# Patient Record
Sex: Male | Born: 1950
Health system: Southern US, Community
[De-identification: ages and names within clinical notes are randomized; demographics above are authoritative.]

## PROBLEM LIST (undated history)

## (undated) DIAGNOSIS — E039 Hypothyroidism, unspecified: Secondary | ICD-10-CM

## (undated) DIAGNOSIS — M1990 Unspecified osteoarthritis, unspecified site: Secondary | ICD-10-CM

## (undated) HISTORY — DX: Unspecified osteoarthritis, unspecified site: M19.90

## (undated) HISTORY — DX: Hypothyroidism, unspecified: E03.9

---

## 2009-06-02 HISTORY — PX: COLONOSCOPY: SHX174

## 2009-09-13 ENCOUNTER — Encounter (INDEPENDENT_AMBULATORY_CARE_PROVIDER_SITE_OTHER): Payer: Self-pay | Admitting: *Deleted

## 2009-09-17 ENCOUNTER — Ambulatory Visit: Payer: Self-pay | Admitting: Gastroenterology

## 2009-10-01 ENCOUNTER — Ambulatory Visit: Payer: Self-pay | Admitting: Gastroenterology

## 2010-07-02 NOTE — Procedures (Signed)
Summary: Colonoscopy  Patient: Derek Lowe Note: All result statuses are Final unless otherwise noted.  Tests: (1) Colonoscopy (COL)   COL Colonoscopy           DONE     [facility]     [address]     [place], [place]  [address]           COLONOSCOPY PROCEDURE REPORT           PATIENT:  Derek Lowe, Derek Lowe  MR#:  [id number]     BIRTHDATE:  04-01-1951, 59 yrs. old  GENDER:  male     ENDOSCOPIST:  Rachael Fee, MD     REF. BY:  Janace Hoard, M.D.     PROCEDURE DATE:  10/01/2009     PROCEDURE:  Diagnostic Colonoscopy     ASA CLASS:  Class II     INDICATIONS:  Routine Risk Screening     MEDICATIONS:   Fentanyl 50 mcg IV, Versed 6 mg IV           DESCRIPTION OF PROCEDURE:   After the risks benefits and     alternatives of the procedure were thoroughly explained, informed     consent was obtained.  Digital rectal exam was performed and     revealed no rectal masses.   The LB PCF-H180AL [id number] endoscope     was introduced through the anus and advanced to the cecum, which     was identified by both the appendix and ileocecal valve, without     limitations.  The quality of the prep was good, using MoviPrep.     The instrument was then slowly withdrawn as the colon was fully     examined.           <<PROCEDUREIMAGES>>           FINDINGS:  External hemorrhoids were found. These were small and     not thrombosed.  Small scattered diverticula were found throughout     the colon.  This was otherwise a normal examination of the colon     (see image1, image2, and image3).   Retroflexed views in the     rectum revealed no abnormalities.    The scope was then withdrawn     from the patient and the procedure completed.           COMPLICATIONS:  None           ENDOSCOPIC IMPRESSION:     1) Small external hemorrhoids     2) Diverticula, scattered throughout the colon     3) Otherwise normal examination           RECOMMENDATIONS:     1) Continue current colorectal screening  recommendations for     "routine risk" patients with a repeat colonoscopy in 10 years.           REPEAT EXAM:  10 years           ______________________________     Rachael Fee, MD           n.     eSIGNED:   Rachael Fee at 10/01/2009 10:14 AM           Domenica Fail, [id number]  Note: An exclamation mark (!) indicates a result that was not dispersed into the flowsheet. Document Creation Date: 10/01/2009 10:14 AM _______________________________________________________________________  (1) Order result status: Final Collection or observation date-time: 10/01/2009 10:11 Requested date-time:  Receipt date-time:  Reported date-time:  Referring Physician:   Ordering Physician: Rob Bunting [phone]) Specimen Source:  Source: Launa Grill Order Number: [id number] Lab site:   Appended Document: Colonoscopy    Clinical Lists Changes  Observations: Added new observation of COLONNXTDUE: 10/2019 (10/01/2009 15:52)

## 2010-07-02 NOTE — Letter (Signed)
Summary: [facility] Instructions  [facility]  [address] [place], [place] [address]   Phone: [phone]  Fax: [phone]       Derek Lowe    March 03, 1951    MRN: [id number]        Procedure Day [name]:  Derek Lowe  10/01/09     Arrival Time:  9:00AM     Procedure Time:  10:00AM     Location of Procedure:                    _ X_  [facility] (4th Floor)                        PREPARATION FOR COLONOSCOPY WITH MOVIPREP   Starting 5 days prior to your procedure 09/26/09 do not eat nuts, seeds, popcorn, corn, beans, peas,  salads, or any raw vegetables.  Do not take any fiber supplements (e.g. Metamucil, Citrucel, and Benefiber).  THE DAY BEFORE YOUR PROCEDURE         DATE: 09/30/09  DAY: SUNDAY  1.  Drink clear liquids the entire day-NO SOLID FOOD  2.  Do not drink anything colored red or purple.  Avoid juices with pulp.  No orange juice.  3.  Drink at least 64 oz. (8 glasses) of fluid/clear liquids during the day to prevent dehydration and help the prep work efficiently.  CLEAR LIQUIDS INCLUDE: Water Jello Ice Popsicles Tea (sugar ok, no milk/cream) Powdered fruit flavored drinks Coffee (sugar ok, no milk/cream) Gatorade Juice: apple, white grape, white cranberry  Lemonade Clear bullion, consomm, broth Carbonated beverages (any kind) Strained chicken noodle soup Hard Candy                             4.  In the morning, mix first dose of MoviPrep solution:    Empty 1 Pouch A and 1 Pouch B into the disposable container    Add lukewarm drinking water to the top line of the container. Mix to dissolve    Refrigerate (mixed solution should be used within 24 hrs)  5.  Begin drinking the prep at 5:00 p.m. The MoviPrep container is divided by 4 marks.   Every 15 minutes drink the solution down to the next mark (approximately 8 oz) until the full liter is complete.   6.  Follow completed prep with 16 oz of clear liquid of your choice (Nothing  red or purple).  Continue to drink clear liquids until bedtime.  7.  Before going to bed, mix second dose of MoviPrep solution:    Empty 1 Pouch A and 1 Pouch B into the disposable container    Add lukewarm drinking water to the top line of the container. Mix to dissolve    Refrigerate  THE DAY OF YOUR PROCEDURE      DATE: 10/01/09  DAY: MONDAY  Beginning at 5:00AM (5 hours before procedure):         1. Every 15 minutes, drink the solution down to the next mark (approx 8 oz) until the full liter is complete.  2. Follow completed prep with 16 oz. of clear liquid of your choice.    3. You may drink clear liquids until 8:00AM (2 HOURS BEFORE PROCEDURE).   MEDICATION INSTRUCTIONS  Unless otherwise instructed, you should take regular prescription medications with a small sip of water   as early as possible the morning  of your procedure.   Additional medication instructions: n/a         OTHER INSTRUCTIONS  You will need a responsible adult at least 59 years of age to accompany you and drive you home.   This person must remain in the waiting room during your procedure.  Wear loose fitting clothing that is easily removed.  Leave jewelry and other valuables at home.  However, you may wish to bring a book to read or  an iPod/MP3 player to listen to music as you wait for your procedure to start.  Remove all body piercing jewelry and leave at home.  Total time from sign-in until discharge is approximately 2-3 hours.  You should go home directly after your procedure and rest.  You can resume normal activities the  day after your procedure.  The day of your procedure you should not:   Drive   Make legal decisions   Operate machinery   Drink alcohol   Return to work  You will receive specific instructions about eating, activities and medications before you leave.    The above instructions have been reviewed and explained to me by  Sherren Kerns RN  September 17, 2009 1:00  PM      I fully understand and can verbalize these instructions _____________________________ Date _________

## 2010-07-02 NOTE — Miscellaneous (Signed)
Summary: previsit/rm  Clinical Lists Changes  Medications: Added new medication of MOVIPREP 100 GM  SOLR (PEG-KCL-NACL-NASULF-NA ASC-C) As per prep instructions. - Signed Rx of MOVIPREP 100 GM  SOLR (PEG-KCL-NACL-NASULF-NA ASC-C) As per prep instructions.;  #1 x 0;  Signed;  Entered by: Sherren Kerns RN;  Authorized by: Rachael Fee MD;  Method used: Print then Give to Patient Observations: Added new observation of ALLERGY REV: Done (09/17/2009 12:38) Added new observation of NKA: T (09/17/2009 12:38)    Prescriptions: MOVIPREP 100 GM  SOLR (PEG-KCL-NACL-NASULF-NA ASC-C) As per prep instructions.  #1 x 0   Entered by:   Sherren Kerns RN   Authorized by:   Rachael Fee MD   Signed by:   Sherren Kerns RN on 09/17/2009   Method used:   Print then Give to Patient   RxID:   [id number]

## 2017-03-12 DIAGNOSIS — E7800 Pure hypercholesterolemia, unspecified: Secondary | ICD-10-CM | POA: Diagnosis not present

## 2017-03-12 DIAGNOSIS — N419 Inflammatory disease of prostate, unspecified: Secondary | ICD-10-CM | POA: Diagnosis not present

## 2017-03-12 DIAGNOSIS — E119 Type 2 diabetes mellitus without complications: Secondary | ICD-10-CM | POA: Diagnosis not present

## 2017-03-12 DIAGNOSIS — E039 Hypothyroidism, unspecified: Secondary | ICD-10-CM | POA: Diagnosis not present

## 2017-03-12 DIAGNOSIS — Z139 Encounter for screening, unspecified: Secondary | ICD-10-CM | POA: Diagnosis not present

## 2017-03-12 DIAGNOSIS — Z0000 Encounter for general adult medical examination without abnormal findings: Secondary | ICD-10-CM | POA: Diagnosis not present

## 2017-03-27 DIAGNOSIS — E041 Nontoxic single thyroid nodule: Secondary | ICD-10-CM | POA: Diagnosis not present

## 2017-03-27 DIAGNOSIS — R946 Abnormal results of thyroid function studies: Secondary | ICD-10-CM | POA: Diagnosis not present

## 2017-03-27 DIAGNOSIS — E039 Hypothyroidism, unspecified: Secondary | ICD-10-CM | POA: Diagnosis not present

## 2017-03-27 DIAGNOSIS — R799 Abnormal finding of blood chemistry, unspecified: Secondary | ICD-10-CM | POA: Diagnosis not present

## 2017-04-02 DIAGNOSIS — E041 Nontoxic single thyroid nodule: Secondary | ICD-10-CM | POA: Diagnosis not present

## 2017-04-02 DIAGNOSIS — E039 Hypothyroidism, unspecified: Secondary | ICD-10-CM | POA: Diagnosis not present

## 2017-04-29 DIAGNOSIS — N529 Male erectile dysfunction, unspecified: Secondary | ICD-10-CM | POA: Diagnosis not present

## 2017-04-29 DIAGNOSIS — E039 Hypothyroidism, unspecified: Secondary | ICD-10-CM | POA: Diagnosis not present

## 2017-04-29 DIAGNOSIS — E041 Nontoxic single thyroid nodule: Secondary | ICD-10-CM | POA: Diagnosis not present

## 2017-05-06 ENCOUNTER — Ambulatory Visit (INDEPENDENT_AMBULATORY_CARE_PROVIDER_SITE_OTHER): Payer: Medicare Other | Admitting: Endocrinology

## 2017-05-06 ENCOUNTER — Encounter: Payer: Self-pay | Admitting: Endocrinology

## 2017-05-06 DIAGNOSIS — E039 Hypothyroidism, unspecified: Secondary | ICD-10-CM | POA: Insufficient documentation

## 2017-05-06 DIAGNOSIS — E042 Nontoxic multinodular goiter: Secondary | ICD-10-CM

## 2017-05-06 LAB — TSH: TSH: 3.06 u[IU]/mL (ref 0.35–4.50)

## 2017-05-06 NOTE — Progress Notes (Signed)
Subjective:    Patient ID: Derek Lowe, male    DOB: [birth year], 66 y.o.   MRN: [id number]  HPI Pt reports hypothyroidism was dx'ed in 2018.  He was rx'ed synthroid.  He has never taken kelp or any other type of non-prescribed thyroid product.  He has never had thyroid surgery, or XRT to the neck.  He has never been on amiodarone or lithium.   He has slight itching, but no assoc leg edema History reviewed. No pertinent past medical history.  History reviewed. No pertinent surgical history.  Social History   Socioeconomic History  . Marital status: Married    Spouse name: Not on file  . Number of children: Not on file  . Years of education: Not on file  . Highest education level: Not on file  Social Needs  . Financial resource strain: Not on file  . Food insecurity - worry: Not on file  . Food insecurity - inability: Not on file  . Transportation needs - medical: Not on file  . Transportation needs - non-medical: Not on file  Occupational History  . Not on file  Tobacco Use  . Smoking status: Never Smoker  . Smokeless tobacco: Current User    Types: Chew  Substance and Sexual Activity  . Alcohol use: Yes  . Drug use: No  . Sexual activity: Yes  Other Topics Concern  . Not on file  Social History Narrative  . Not on file    Current Outpatient Medications on File Prior to Visit  Medication Sig Dispense Refill  . levothyroxine (SYNTHROID, LEVOTHROID) 25 MCG tablet Take 25 mcg by mouth daily before breakfast.     No current facility-administered medications on file prior to visit.     No Known Allergies  Family History  Problem Relation Age of Onset  . Thyroid disease Sister    BP (!) 142/80 (BP Location: Right Arm, Patient Position: Sitting, Cuff Size: Normal)   Pulse 87   Wt 201 lb (91.2 kg)   SpO2 97%   Review of Systems Denies hoarseness, neck pain, visual loss, chest pain, sob, cough, dysphagia, diarrhea, flushing, easy bruising, depression, cold  intolerance, headache, numbness, and rhinorrhea.  He has lost weight, due to his efforts.     Objective:   Physical Exam VS: see vs page GEN: no distress HEAD: head: no deformity eyes: no periorbital swelling, no proptosis external nose and ears are normal mouth: no lesion seen NECK: supple, thyroid is not enlarged, but the LLP nodule is palpable CHEST WALL: no deformity LUNGS: clear to auscultation CV: reg rate and rhythm, no murmur ABD: abdomen is soft, nontender.  no hepatosplenomegaly.  not distended.  no hernia MUSCULOSKELETAL: muscle bulk and strength are grossly normal.  no obvious joint swelling.  gait is normal and steady EXTEMITIES: no deformity.  no edema PULSES: no carotid bruit NEURO:  cn 2-12 grossly intact.   readily moves all 4's.  sensation is intact to touch on all 4's SKIN:  Normal texture and temperature.  No rash or suspicious lesion is visible.   NODES:  None palpable at the neck PSYCH: alert, well-oriented.  Does not appear anxious nor depressed.  I have reviewed outside records, and summarized: Pt was noted to have elevated a1c, and referred here.  He did not have any other active medical probs.  [name]: multinodular goiter.  The largest nodule was at the [place]  outside test results are reviewed: TSH=4.5     Assessment & Plan:  Hypothyroidism, new, usually due to chronic thyroiditis Multinodular goiter, with uncertain relationship to hypothyroidism.  We'll follow  Patient Instructions  blood tests are requested for you today.  We'll let you know about the results. Please come back for a follow-up appointment in 6-12 months, when you will be due for another ultrasound (if you want to redo at [facility], that would be fine, too).

## 2017-05-06 NOTE — Patient Instructions (Addendum)
blood tests are requested for you today.  We'll let you know about the results. Please come back for a follow-up appointment in 6-12 months, when you will be due for another ultrasound (if you want to redo at [facility], that would be fine, too).

## 2017-05-10 LAB — CALCITONIN: CALCITONIN: 2 pg/mL (ref <=10)

## 2017-05-22 ENCOUNTER — Telehealth: Payer: Self-pay | Admitting: Endocrinology

## 2017-05-22 NOTE — Telephone Encounter (Signed)
Please see note. This regarding the levothyroxine he thinks is causing him to break out in this rash.

## 2017-05-22 NOTE — Telephone Encounter (Signed)
The rash he had returned yesterday he was putting lotion on it but it did go away for about 2-3 days. He is wondering if the med is still causing the rash

## 2017-05-22 NOTE — Telephone Encounter (Signed)
Try stopping x a few days, then resume.  Let us know what happens

## 2017-05-27 NOTE — Telephone Encounter (Signed)
Tried calling patient but he was not home. I left a message that I called with patient's wife.

## 2017-05-28 ENCOUNTER — Telehealth: Payer: Self-pay

## 2017-05-28 NOTE — Telephone Encounter (Signed)
Patient called back & stated that he had stopped levothyroxine over a week ago, but was still itching. I advised him to start taking mediation again & he may want to follow up with PCP.

## 2017-05-29 DIAGNOSIS — L300 Nummular dermatitis: Secondary | ICD-10-CM | POA: Diagnosis not present

## 2017-05-29 DIAGNOSIS — L299 Pruritus, unspecified: Secondary | ICD-10-CM | POA: Diagnosis not present

## 2017-06-30 ENCOUNTER — Telehealth: Payer: Self-pay | Admitting: Endocrinology

## 2017-06-30 NOTE — Telephone Encounter (Signed)
LM for pt to call back with PCP name and referring MD name

## 2017-06-30 NOTE — Telephone Encounter (Signed)
-----   Message from Renato Shin, MD sent at 05/08/2017  7:53 PM EST ----- This pt does not have a ref provider, thanks

## 2017-08-12 ENCOUNTER — Telehealth: Payer: Self-pay | Admitting: Endocrinology

## 2017-08-12 ENCOUNTER — Other Ambulatory Visit: Payer: Self-pay

## 2017-08-12 MED ORDER — LEVOTHYROXINE SODIUM 25 MCG PO TABS: 25.0000 ug | ORAL_TABLET | Freq: Every day | ORAL | 4 refills | Status: DC

## 2017-08-12 NOTE — Telephone Encounter (Signed)
Patient's wife,Vickie,called.  Patient needs a refill on his Levothyroxine.  Patient has 4 pills left.  Please call in refill to [facility].

## 2017-08-12 NOTE — Telephone Encounter (Signed)
I have sent to patient;'s pharmacy.  

## 2017-08-31 DIAGNOSIS — L501 Idiopathic urticaria: Secondary | ICD-10-CM | POA: Diagnosis not present

## 2017-08-31 DIAGNOSIS — L299 Pruritus, unspecified: Secondary | ICD-10-CM | POA: Diagnosis not present

## 2017-08-31 DIAGNOSIS — L309 Dermatitis, unspecified: Secondary | ICD-10-CM | POA: Diagnosis not present

## 2017-10-09 DIAGNOSIS — L501 Idiopathic urticaria: Secondary | ICD-10-CM | POA: Diagnosis not present

## 2017-10-09 DIAGNOSIS — L578 Other skin changes due to chronic exposure to nonionizing radiation: Secondary | ICD-10-CM | POA: Diagnosis not present

## 2017-10-09 DIAGNOSIS — R531 Weakness: Secondary | ICD-10-CM | POA: Diagnosis not present

## 2018-05-07 ENCOUNTER — Ambulatory Visit: Payer: 59 | Admitting: Endocrinology

## 2018-05-10 ENCOUNTER — Encounter: Payer: Self-pay | Admitting: Endocrinology

## 2018-05-10 ENCOUNTER — Ambulatory Visit (INDEPENDENT_AMBULATORY_CARE_PROVIDER_SITE_OTHER): Payer: Medicare Other | Admitting: Endocrinology

## 2018-05-10 VITALS — BP 136/86 | HR 78 | Ht 68.5 in | Wt 197.8 lb

## 2018-05-10 DIAGNOSIS — E039 Hypothyroidism, unspecified: Secondary | ICD-10-CM

## 2018-05-10 DIAGNOSIS — E042 Nontoxic multinodular goiter: Secondary | ICD-10-CM | POA: Diagnosis not present

## 2018-05-10 LAB — T4, FREE: Free T4: 0.64 ng/dL (ref 0.60–1.60)

## 2018-05-10 LAB — TSH: TSH: 5.15 u[IU]/mL — ABNORMAL HIGH (ref 0.35–4.50)

## 2018-05-10 MED ORDER — LEVOTHYROXINE SODIUM 50 MCG PO TABS: 25.0000 ug | ORAL_TABLET | Freq: Every day | ORAL | 6 refills | Status: DC

## 2018-05-10 NOTE — Progress Notes (Signed)
Subjective:    Patient ID: Derek Lowe, male    DOB: 1951-03-21, 67 y.o.   MRN: [id number]  HPI  Pt returns for f/u hypothyroidism (dx'ed 2018; he has been on synthroid since then; [name] in [place] showed multinodular goiter--the largest nodule was at the [facility]).  He stopped synthroid, due to itching.  Denies neck swelling.   Past Medical History:  Diagnosis Date  . Hypothyroidism     No past surgical history on file.  Social History   Socioeconomic History  . Marital status: Married    Spouse name: Not on file  . Number of children: Not on file  . Years of education: Not on file  . Highest education level: Not on file  Occupational History  . Not on file  Social Needs  . Financial resource strain: Not on file  . Food insecurity:    Worry: Not on file    Inability: Not on file  . Transportation needs:    Medical: Not on file    Non-medical: Not on file  Tobacco Use  . Smoking status: Never Smoker  . Smokeless tobacco: Current User    Types: Chew  Substance and Sexual Activity  . Alcohol use: Yes  . Drug use: No  . Sexual activity: Yes  Lifestyle  . Physical activity:    Days per week: Not on file    Minutes per session: Not on file  . Stress: Not on file  Relationships  . Social connections:    Talks on phone: Not on file    Gets together: Not on file    Attends religious service: Not on file    Active member of club or organization: Not on file    Attends meetings of clubs or organizations: Not on file    Relationship status: Not on file  . Intimate partner violence:    Fear of current or ex partner: Not on file    Emotionally abused: Not on file    Physically abused: Not on file    Forced sexual activity: Not on file  Other Topics Concern  . Not on file  Social History Narrative  . Not on file    No current outpatient medications on file prior to visit.   No current facility-administered medications on file prior to visit.     No Known  Allergies  Family History  Problem Relation Age of Onset  . Thyroid disease Sister     BP 136/86 (BP Location: Left Arm, Patient Position: Sitting, Cuff Size: Normal)   Pulse 78   Ht 5' 8.5" (1.74 m)   Wt 197 lb 12.8 oz (89.7 kg)   SpO2 97%   BMI 29.63 kg/m   Review of Systems Denies neck pain    Objective:   Physical Exam VITAL SIGNS:  See vs page GENERAL: no distress NECK: thyroid is slightly enlarged (L>R), with irreg surface  Lab Results  Component Value Date   TSH 5.15 (H) 05/10/2018       Assessment & Plan:  Hypothyroidism: I have sent a prescription to your pharmacy, to resume synthroid. Multinodular goiter: due fore recheck.   Patient Instructions  blood tests are requested for you today.  We'll let you know about the results.  Let's recheck the ultrasound.  you will receive a phone call, about a day and time for an appointment.   Please sign a release of info for the ultrasound report from last year.   Please come back for  a follow-up appointment in 1 year.

## 2018-05-10 NOTE — Patient Instructions (Addendum)
blood tests are requested for you today.  We'll let you know about the results.  Let's recheck the ultrasound.  you will receive a phone call, about a day and time for an appointment.   Please sign a release of info for the ultrasound report from last year.   Please come back for a follow-up appointment in 1 year.

## 2018-05-11 ENCOUNTER — Telehealth: Payer: Self-pay | Admitting: Endocrinology

## 2018-05-11 NOTE — Telephone Encounter (Signed)
Closed as duplicate 

## 2018-05-11 NOTE — Telephone Encounter (Signed)
Patient called and is returning a call to returning a call to [name]

## 2018-05-17 ENCOUNTER — Other Ambulatory Visit: Payer: Medicare Other

## 2018-07-22 ENCOUNTER — Telehealth: Payer: Self-pay | Admitting: Endocrinology

## 2018-07-22 NOTE — Telephone Encounter (Signed)
please contact patient: We received [name] report from 2018.  You should recheck this.  Have you been called about your appt yet?

## 2018-07-22 NOTE — Telephone Encounter (Signed)
Called pt to make him aware. States he was certain he had [name] completed at [facility]. Provided with contact #[phone] and advised to further discuss if he did have completed. If not, advised he schedule an appt for completion. Verbalized acceptance and understanding.

## 2018-07-27 ENCOUNTER — Ambulatory Visit
Admission: RE | Admit: 2018-07-27 | Discharge: 2018-07-27 | Disposition: A | Discharge status: Home / Self Care | Payer: Medicare Other | Source: Ambulatory Visit | Attending: Endocrinology | Admitting: Endocrinology

## 2018-07-27 DIAGNOSIS — E042 Nontoxic multinodular goiter: Secondary | ICD-10-CM

## 2018-07-29 ENCOUNTER — Other Ambulatory Visit: Payer: Self-pay | Admitting: Endocrinology

## 2018-07-29 DIAGNOSIS — E042 Nontoxic multinodular goiter: Secondary | ICD-10-CM

## 2018-08-03 ENCOUNTER — Other Ambulatory Visit (HOSPITAL_COMMUNITY)
Admission: RE | Admit: 2018-08-03 | Discharge: 2018-08-03 | Disposition: A | Discharge status: Home / Self Care | Payer: Medicare Other | Source: Ambulatory Visit | Attending: Physician Assistant | Admitting: Physician Assistant

## 2018-08-03 ENCOUNTER — Ambulatory Visit
Admission: RE | Admit: 2018-08-03 | Discharge: 2018-08-03 | Disposition: A | Discharge status: Home / Self Care | Payer: Medicare Other | Source: Ambulatory Visit | Attending: Endocrinology | Admitting: Endocrinology

## 2018-08-03 DIAGNOSIS — E042 Nontoxic multinodular goiter: Secondary | ICD-10-CM

## 2018-08-03 DIAGNOSIS — E041 Nontoxic single thyroid nodule: Secondary | ICD-10-CM | POA: Diagnosis not present

## 2018-08-03 NOTE — Procedures (Signed)
PROCEDURE SUMMARY:  Using direct ultrasound guidance, 5 passes were made using 25 g needles into the nodule within the left lobe of the thyroid.   Ultrasound was used to confirm needle placements on all occasions.   EBL = trace  Specimens were sent to Pathology for analysis.  See procedure note under Imaging tab in Epic for full procedure details.  Hrishikesh Hoeg S Arlee Bossard PA-C 08/03/2018 3:04 PM

## 2019-02-10 ENCOUNTER — Other Ambulatory Visit: Payer: Self-pay

## 2019-02-14 ENCOUNTER — Ambulatory Visit (INDEPENDENT_AMBULATORY_CARE_PROVIDER_SITE_OTHER): Payer: Medicare Other | Admitting: Endocrinology

## 2019-02-14 ENCOUNTER — Encounter: Payer: Self-pay | Admitting: Endocrinology

## 2019-02-14 ENCOUNTER — Telehealth: Payer: Self-pay | Admitting: Endocrinology

## 2019-02-14 ENCOUNTER — Other Ambulatory Visit: Payer: Self-pay

## 2019-02-14 VITALS — BP 128/60 | HR 82 | Ht 68.5 in | Wt 182.2 lb

## 2019-02-14 DIAGNOSIS — E039 Hypothyroidism, unspecified: Secondary | ICD-10-CM | POA: Diagnosis not present

## 2019-02-14 DIAGNOSIS — E042 Nontoxic multinodular goiter: Secondary | ICD-10-CM

## 2019-02-14 LAB — TSH: TSH: 2.59 u[IU]/mL (ref 0.35–4.50)

## 2019-02-14 LAB — T4, FREE: Free T4: 0.91 ng/dL (ref 0.60–1.60)

## 2019-02-14 MED ORDER — LEVOTHYROXINE SODIUM 50 MCG PO TABS: 25.0000 ug | ORAL_TABLET | Freq: Every day | ORAL | 0 refills | Status: DC

## 2019-02-14 NOTE — Telephone Encounter (Signed)
levothyroxine (SYNTHROID) 50 MCG tablet 15 tablet 0 02/14/2019    Sig - Route: Take 0.5 tablets (25 mcg total) by mouth daily before breakfast. - Oral   Sent to pharmacy as: levothyroxine (SYNTHROID) 50 MCG tablet   E-Prescribing Status: Receipt confirmed by pharmacy (02/14/2019 2:41 PM EDT)

## 2019-02-14 NOTE — Patient Instructions (Signed)
Let's recheck the ultrasound.  you will receive a phone call, about a day and time for an appointment. Blood tests are requested for you today.  We'll let you know about the results.  Please come back for a follow-up appointment in 6 months.   

## 2019-02-14 NOTE — Progress Notes (Signed)
   Subjective:    Patient ID: Derek Lowe, male    DOB: 06/19/1950, 68 y.o.   MRN: [id number]  HPI Pt returns for f/u hypothyroidism (dx'ed 2018; he has been on synthroid since then; [name] in [place] showed multinodular goiter; he did not tolerate synthroid 25 mcg, due to itching; f/u US in 2020 showed LLP nodule needs bx--this showed beth cat 1; he declined f/u bx).  pt states he feels well in general.   Past Medical History:  Diagnosis Date  . Hypothyroidism     No past surgical history on file.  Social History   Socioeconomic History  . Marital status: Married    Spouse name: Not on file  . Number of children: Not on file  . Years of education: Not on file  . Highest education level: Not on file  Occupational History  . Not on file  Social Needs  . Financial resource strain: Not on file  . Food insecurity    Worry: Not on file    Inability: Not on file  . Transportation needs    Medical: Not on file    Non-medical: Not on file  Tobacco Use  . Smoking status: Never Smoker  . Smokeless tobacco: Current User    Types: Chew  Substance and Sexual Activity  . Alcohol use: Yes  . Drug use: No  . Sexual activity: Yes  Lifestyle  . Physical activity    Days per week: Not on file    Minutes per session: Not on file  . Stress: Not on file  Relationships  . Social Herbalist on phone: Not on file    Gets together: Not on file    Attends religious service: Not on file    Active member of club or organization: Not on file    Attends meetings of clubs or organizations: Not on file    Relationship status: Not on file  . Intimate partner violence    Fear of current or ex partner: Not on file    Emotionally abused: Not on file    Physically abused: Not on file    Forced sexual activity: Not on file  Other Topics Concern  . Not on file  Social History Narrative  . Not on file    No current outpatient medications on file prior to visit.   No current  facility-administered medications on file prior to visit.     No Known Allergies  Family History  Problem Relation Age of Onset  . Thyroid disease Sister     BP 128/60 (BP Location: Left Arm, Patient Position: Sitting, Cuff Size: Normal)   Pulse 82   Ht 5' 8.5" (1.74 m)   Wt 182 lb 3.2 oz (82.6 kg)   SpO2 96%   BMI 27.30 kg/m    Review of Systems  Denies neck swelling.     Objective:   Physical Exam VITAL SIGNS:  See vs page.  GENERAL: no distress.  NECK: thyroid is slightly enlarged, with multinodular surface.  No palpable lymphadenopathy at the anterior neck.        Assessment & Plan:  MNG: due for recheck Hypothyroidism: well-replaced.   Patient Instructions  Let's recheck the ultrasound.  you will receive a phone call, about a day and time for an appointment. Blood tests are requested for you today.  We'll let you know about the results.  Please come back for a follow-up appointment in 6 months.

## 2019-02-14 NOTE — Telephone Encounter (Signed)
Patient just saw dr. Loanne Drilling and forgot to tell him that he just took his last pill (Levothyroxine). Patient requests RX be sent asap for: MEDICATION: levothyroxine (SYNTHROID, LEVOTHROID) 50 MCG tablet  PHARMACY:   [id number] [address], [place] [phone] (Phone) [phone] (Fax)     IS THIS A 90 DAY SUPPLY : ?  IS PATIENT OUT OF MEDICATION: Yes  IF NOT; HOW MUCH IS LEFT:   LAST APPOINTMENT DATE: @9 /14/2020  NEXT APPOINTMENT DATE:@Visit  date not found  DO WE HAVE YOUR PERMISSION TO LEAVE A DETAILED MESSAGE:Yes  OTHER COMMENTS:    **Let patient know to contact pharmacy at the end of the day to make sure medication is ready. **  ** Please notify patient to allow 48-72 hours to process**  **Encourage patient to contact the pharmacy for refills or they can request refills through [facility]**

## 2019-03-14 ENCOUNTER — Ambulatory Visit
Admission: RE | Admit: 2019-03-14 | Discharge: 2019-03-14 | Disposition: A | Discharge status: Home / Self Care | Payer: Medicare Other | Source: Ambulatory Visit | Attending: Endocrinology | Admitting: Endocrinology

## 2019-03-14 DIAGNOSIS — E041 Nontoxic single thyroid nodule: Secondary | ICD-10-CM | POA: Diagnosis not present

## 2019-03-14 DIAGNOSIS — E042 Nontoxic multinodular goiter: Secondary | ICD-10-CM

## 2019-05-09 ENCOUNTER — Ambulatory Visit: Payer: Medicare Other | Admitting: Endocrinology

## 2019-05-13 DIAGNOSIS — M1990 Unspecified osteoarthritis, unspecified site: Secondary | ICD-10-CM | POA: Diagnosis not present

## 2019-05-13 DIAGNOSIS — M25511 Pain in right shoulder: Secondary | ICD-10-CM | POA: Diagnosis not present

## 2019-07-20 ENCOUNTER — Ambulatory Visit (INDEPENDENT_AMBULATORY_CARE_PROVIDER_SITE_OTHER): Payer: Medicare Other

## 2019-07-20 ENCOUNTER — Other Ambulatory Visit: Payer: Self-pay

## 2019-07-20 ENCOUNTER — Ambulatory Visit (INDEPENDENT_AMBULATORY_CARE_PROVIDER_SITE_OTHER): Payer: Medicare Other | Admitting: Orthopedic Surgery

## 2019-07-20 ENCOUNTER — Ambulatory Visit: Payer: Self-pay

## 2019-07-20 DIAGNOSIS — M25512 Pain in left shoulder: Secondary | ICD-10-CM | POA: Diagnosis not present

## 2019-07-20 DIAGNOSIS — M7542 Impingement syndrome of left shoulder: Secondary | ICD-10-CM

## 2019-07-20 DIAGNOSIS — M25511 Pain in right shoulder: Secondary | ICD-10-CM

## 2019-07-20 DIAGNOSIS — M7541 Impingement syndrome of right shoulder: Secondary | ICD-10-CM

## 2019-07-22 ENCOUNTER — Encounter: Payer: Self-pay | Admitting: Orthopedic Surgery

## 2019-07-22 NOTE — Progress Notes (Signed)
Office Visit Note   Patient: Derek Lowe           Date of Birth: [birth year]           MRN: [id number] Visit Date: 07/20/2019 Requested by: No referring provider defined for this encounter. PCP: Patient, No Pcp Per  Subjective: Chief Complaint  Patient presents with  . Left Shoulder - Pain  . Right Shoulder - Pain    HPI: Derek Lowe is a 69 y.o. male who presents to the office complaining of bilateral shoulder pain.  Patient notes that for the last month and a half he has had bilateral shoulder pain with his right shoulder bothering him more than his left.  He denies any acute injury, stating that his pain came on when he woke up from sleep 1 day.  He localizes pain to the posterior aspect of his shoulders.  Pain radiates to the elbow.  He denies any neck pain/radicular symptoms/numbness/tingling.  Pain wakes him up at night.  He was seen by his primary care doctor who ordered x-rays that revealed acromioclavicular osteoarthritis.  He had 1 injection into presumably the [facility] joint as he describes "they stuck the needle straight down into my top of my shoulder".  This is 1 month ago and provided him some relief but pain has recurred.  He denies any weakness of the shoulder.  He has never had surgery on his neck or shoulder.  He has been taking meloxicam with some relief.  He denies any history of diabetes but does note a history of hypothyroidism for which he takes Synthroid..                ROS:  All systems reviewed are negative as they relate to the chief complaint within the history of present illness.  Patient denies fevers or chills.  Assessment & Plan: Visit Diagnoses:  1. Bilateral shoulder pain, unspecified chronicity   2. Impingement syndrome of right shoulder   3. Impingement syndrome of left shoulder     Plan: Patient is a 69 year old male who presents complaining of bilateral shoulder pain, right greater than left.  No injury was associated with the onset of pain.  He has no  weakness on exam and excellent range of motion.  He has good functional range of motion as well.  On exam he has excellent strength of all of the rotator cuff muscles but does have some pain with impingement tests.  Impression is impingement versus bursitis.  Plan for bilateral shoulder injections into the subacromial space today.  Patient will follow up in 6 weeks for clinical recheck.  We will decide whether or not to proceed with MRI of the shoulder at this time.  Patient agreed with plan and will follow up in 6 weeks.  Follow-Up Instructions: No follow-ups on file.   Orders:  Orders Placed This Encounter  Procedures  . XR Shoulder Right  . XR Shoulder Left   No orders of the defined types were placed in this encounter.     Procedures: Large Joint Inj: bilateral subacromial bursa on 07/28/2019 8:40 PM Indications: diagnostic evaluation and pain Details: 18 G 1.5 in needle, posterior approach  Arthrogram: No  Medications (Right): 5 mL lidocaine 1 %; 9 mL bupivacaine 0.5 %; 40 mg methylPREDNISolone acetate 40 MG/ML Medications (Left): 5 mL lidocaine 1 %; 9 mL bupivacaine 0.5 %; 40 mg methylPREDNISolone acetate 40 MG/ML Outcome: tolerated well, no immediate complications Procedure, treatment alternatives, risks and benefits explained, specific risks  discussed. Consent was given by the patient. Immediately prior to procedure a time out was called to verify the correct patient, procedure, equipment, support staff and site/side marked as required. Patient was prepped and draped in the usual sterile fashion.       Clinical Data: No additional findings.  Objective: Vital Signs: There were no vitals taken for this visit.  Physical Exam:  Constitutional: Patient appears well-developed HEENT:  Head: Normocephalic Eyes:EOM are normal Neck: Normal range of motion Cardiovascular: Normal rate Pulmonary/chest: Effort normal Neurologic: Patient is alert Skin: Skin is warm Psychiatric:  Patient has normal mood and affect  Ortho Exam:  Bilateral shoulder Exam Able to fully forward flex and abduct shoulder overhead No loss of ER relative to the other shoulder.  Good endpoint with ER No TTP over the [facility] joint or bicipital groove Good subscapularis, supraspinatus, and infraspinatus strength Positive Hawkins impingement 5/5 grip strength, forearm pronation/supination, and bicep strength  Specialty Comments:  No specialty comments available.  Imaging: No results found.   PMFS History: Patient Active Problem List   Diagnosis Date Noted  . Multinodular goiter 05/06/2017  . Hypothyroidism 05/06/2017   Past Medical History:  Diagnosis Date  . Hypothyroidism     Family History  Problem Relation Age of Onset  . Thyroid disease Sister     No past surgical history on file. Social History   Occupational History  . Not on file  Tobacco Use  . Smoking status: Never Smoker  . Smokeless tobacco: Current User    Types: Chew  Substance and Sexual Activity  . Alcohol use: Yes  . Drug use: No  . Sexual activity: Yes

## 2019-07-26 ENCOUNTER — Other Ambulatory Visit: Payer: Self-pay | Admitting: Endocrinology

## 2019-07-26 DIAGNOSIS — E039 Hypothyroidism, unspecified: Secondary | ICD-10-CM

## 2019-07-28 ENCOUNTER — Encounter: Payer: Self-pay | Admitting: Orthopedic Surgery

## 2019-07-28 DIAGNOSIS — M7541 Impingement syndrome of right shoulder: Secondary | ICD-10-CM

## 2019-07-28 DIAGNOSIS — M7542 Impingement syndrome of left shoulder: Secondary | ICD-10-CM

## 2019-07-28 MED ORDER — METHYLPREDNISOLONE ACETATE 40 MG/ML IJ SUSP
40.0000 mg | INTRAMUSCULAR | Status: AC | PRN
  Administered 2019-07-28: 40 mg via INTRA_ARTICULAR

## 2019-07-28 MED ORDER — BUPIVACAINE HCL 0.5 % IJ SOLN
9.0000 mL | INTRAMUSCULAR | Status: AC | PRN
  Administered 2019-07-28: 9 mL via INTRA_ARTICULAR

## 2019-07-28 MED ORDER — LIDOCAINE HCL 1 % IJ SOLN
5.0000 mL | INTRAMUSCULAR | Status: AC | PRN
  Administered 2019-07-28: 5 mL

## 2019-08-15 ENCOUNTER — Other Ambulatory Visit: Payer: Self-pay

## 2019-08-15 ENCOUNTER — Ambulatory Visit (INDEPENDENT_AMBULATORY_CARE_PROVIDER_SITE_OTHER): Payer: Medicare Other | Admitting: Endocrinology

## 2019-08-15 ENCOUNTER — Encounter: Payer: Self-pay | Admitting: Endocrinology

## 2019-08-15 VITALS — BP 120/70 | HR 73 | Ht 68.5 in | Wt 189.4 lb

## 2019-08-15 DIAGNOSIS — E039 Hypothyroidism, unspecified: Secondary | ICD-10-CM | POA: Diagnosis not present

## 2019-08-15 LAB — TSH: TSH: 3.28 u[IU]/mL (ref 0.35–4.50)

## 2019-08-15 LAB — T4, FREE: Free T4: 0.87 ng/dL (ref 0.60–1.60)

## 2019-08-15 NOTE — Progress Notes (Signed)
Subjective:    Patient ID: Derek Lowe, male    DOB: 07-30-1950, 69 y.o.   MRN: [id number]  HPI Pt returns for f/u hypothyroidism (dx'ed 2018; he has been on synthroid since then; [name] in [place] showed multinodular goiter; he did not tolerate synthroid 25 mcg, (itching), so he takes 1/2 of a 50 mcg QD; f/u US in 2020 showed LLP nodule needs bx--this showed beth cat 1; he declined f/u bx).  pt states he feels well in general.  He takes synthroid as rx'ed.   Past Medical History:  Diagnosis Date  . Hypothyroidism     No past surgical history on file.  Social History   Socioeconomic History  . Marital status: Married    Spouse name: Not on file  . Number of children: Not on file  . Years of education: Not on file  . Highest education level: Not on file  Occupational History  . Not on file  Tobacco Use  . Smoking status: Never Smoker  . Smokeless tobacco: Current User    Types: Chew  Substance and Sexual Activity  . Alcohol use: Yes  . Drug use: No  . Sexual activity: Yes  Other Topics Concern  . Not on file  Social History Narrative  . Not on file   Social Determinants of Health   Financial Resource Strain:   . Difficulty of Paying Living Expenses:   Food Insecurity:   . Worried About Charity fundraiser in the Last Year:   . Arboriculturist in the Last Year:   Transportation Needs:   . Film/video editor (Medical):   Marland Kitchen Lack of Transportation (Non-Medical):   Physical Activity:   . Days of Exercise per Week:   . Minutes of Exercise per Session:   Stress:   . Feeling of Stress :   Social Connections:   . Frequency of Communication with Friends and Family:   . Frequency of Social Gatherings with Friends and Family:   . Attends Religious Services:   . Active Member of Clubs or Organizations:   . Attends Archivist Meetings:   Marland Kitchen Marital Status:   Intimate Partner Violence:   . Fear of Current or Ex-Partner:   . Emotionally Abused:   Marland Kitchen Physically  Abused:   . Sexually Abused:     Current Outpatient Medications on File Prior to Visit  Medication Sig Dispense Refill  . ferrous sulfate 324 MG TBEC Take 324 mg by mouth.    . levothyroxine (SYNTHROID) 50 MCG tablet TAKE 1/2 TABLET BY MOUTH DAILY BEFORE BREAKFAST 45 tablet 4  . meloxicam (MOBIC) 15 MG tablet Take 15 mg by mouth daily.    . Omega-3 Fatty Acids (FISH OIL) 1000 MG CAPS Take by mouth.    . triamcinolone cream (KENALOG) 0.1 % APPLY TO AFFECTED AREA(S) 3 TIMES DAILY AS NEEDED     No current facility-administered medications on file prior to visit.    No Known Allergies  Family History  Problem Relation Age of Onset  . Thyroid disease Sister     BP 120/70   Pulse 73   Ht 5' 8.5" (1.74 m)   Wt 189 lb 6.4 oz (85.9 kg)   SpO2 97%   BMI 28.38 kg/m    Review of Systems Denies neck swelling.      Objective:   Physical Exam VITAL SIGNS:  See vs page GENERAL: no distress NECK: There is no palpable thyroid enlargement.  No thyroid  nodule is palpable.  No palpable lymphadenopathy at the anterior neck.      Lab Results  Component Value Date   TSH 3.28 08/15/2019       Assessment & Plan:  Hypothyroidism: well-replaced MNG: we discussed.  Plan is to recheck [name] next time.  Patient Instructions  Blood tests are requested for you today.  We'll let you know about the results.   Please come back for a follow-up appointment in 6 months.

## 2019-08-15 NOTE — Patient Instructions (Signed)
Blood tests are requested for you today.  We'll let you know about the results.  Please come back for a follow-up appointment in 6 months.   

## 2019-08-16 ENCOUNTER — Telehealth: Payer: Self-pay

## 2019-08-16 NOTE — Telephone Encounter (Signed)
-----   Message from Renato Shin, MD sent at 08/15/2019  6:31 PM EDT ----- please contact patient: Normal.  Please continue the same medication.  I'll see you next time.

## 2019-08-16 NOTE — Telephone Encounter (Signed)
LAB RESULTS  Lab results were reviewed by Dr. Ellison. A letter has been mailed to pt home address. For future reference, letter can be found in Epic. 

## 2019-08-29 ENCOUNTER — Other Ambulatory Visit: Payer: Self-pay

## 2019-08-29 ENCOUNTER — Ambulatory Visit (INDEPENDENT_AMBULATORY_CARE_PROVIDER_SITE_OTHER): Payer: Medicare Other | Admitting: Orthopedic Surgery

## 2019-08-29 DIAGNOSIS — M7542 Impingement syndrome of left shoulder: Secondary | ICD-10-CM | POA: Diagnosis not present

## 2019-08-29 DIAGNOSIS — M7541 Impingement syndrome of right shoulder: Secondary | ICD-10-CM

## 2019-09-04 ENCOUNTER — Encounter: Payer: Self-pay | Admitting: Orthopedic Surgery

## 2019-09-04 NOTE — Progress Notes (Signed)
Office Visit Note   Patient: Derek Lowe           Date of Birth: 1951-05-21           MRN: [id number] Visit Date: 08/29/2019 Requested by: No referring provider defined for this encounter. PCP: Suzan Garibaldi, FNP  Subjective: Chief Complaint  Patient presents with  . Follow-up    HPI: Derek Lowe is a 69 y.o. male who presents to the office complaining of bilateral shoulder pain.  Patient had bilateral shoulder injections on 07/28/2019 that provided only short relief.  He did feel 80% better for couple weeks but now pain is returned.  Pain is worse in the mornings and improves throughout the day.  Left shoulder is bothering him more than the right shoulder lately.  He denies any consistent neck pain.  He does have a history of a right frozen shoulder.  He is not taking any oral medications for his pain..                ROS:  All systems reviewed are negative as they relate to the chief complaint within the history of present illness.  Patient denies fevers or chills.  Assessment & Plan: Visit Diagnoses:  1. Impingement syndrome of left shoulder   2. Impingement syndrome of right shoulder     Plan: Patient is a 69 year old male who presents complaint of bilateral shoulder pain.  He had bilateral shoulder injections on 07/28/2019 that provided several weeks of 80% relief.  Pain is returned in full force now.  He does have pain with rotator cuff resistance testing as noted in the exam.  Additionally he has impingement signs and a positive O'Brien's test on the left.  Plan for bilateral shoulder MRI arthrogram to evaluate rotator cuff tear versus labral tear.  He will follow-up after MRIs to review results.  He has failed at least 6 weeks of conservative treatment including injection as well as over-the-counter medication and activity modification.  He would consider surgical intervention if identifiable pathology and treatable pathology is present.  Follow-Up Instructions: No follow-ups on  file.   Orders:  Orders Placed This Encounter  Procedures  . DL FLUORO GUIDED NEEDLE PLC ASPIRATION / INJECTTION/LOC  . MR Shoulder Left w/ contrast  . DL FLUORO GUIDED NEEDLE PLC ASPIRATION / INJECTTION/LOC  . MR SHOULDER RIGHT W CONTRAST   No orders of the defined types were placed in this encounter.     Procedures: No procedures performed   Clinical Data: No additional findings.  Objective: Vital Signs: There were no vitals taken for this visit.  Physical Exam:  Constitutional: Patient appears well-developed HEENT:  Head: Normocephalic Eyes:EOM are normal Neck: Normal range of motion Cardiovascular: Normal rate Pulmonary/chest: Effort normal Neurologic: Patient is alert Skin: Skin is warm Psychiatric: Patient has normal mood and affect  Ortho Exam:  Bilateral shoulder Exam Right shoulder pain elicited with supraspinatus resistance testing.  Left shoulder pain elicited with infraspinatus resistance testing.  Positive Neer impingement bilaterally.  Positive O'Brien's test on the left shoulder. Able to fully forward flex and abduct shoulder overhead No loss of ER relative to the other shoulder.  Good endpoint with ER No TTP over the [facility] joint or bicipital groove Good subscapularis, supraspinatus, and infraspinatus strength Negative Hawkins impingement 5/5 grip strength, forearm pronation/supination, and bicep strength  Specialty Comments:  No specialty comments available.  Imaging: No results found.   PMFS History: Patient Active Problem List   Diagnosis Date Noted  .  Multinodular goiter 05/06/2017  . Hypothyroidism 05/06/2017   Past Medical History:  Diagnosis Date  . Hypothyroidism     Family History  Problem Relation Age of Onset  . Thyroid disease Sister     No past surgical history on file. Social History   Occupational History  . Not on file  Tobacco Use  . Smoking status: Never Smoker  . Smokeless tobacco: Current User    Types: Chew    Substance and Sexual Activity  . Alcohol use: Yes  . Drug use: No  . Sexual activity: Yes

## 2019-09-28 ENCOUNTER — Other Ambulatory Visit: Payer: Medicare Other

## 2019-09-30 ENCOUNTER — Ambulatory Visit
Admission: RE | Admit: 2019-09-30 | Discharge: 2019-09-30 | Disposition: A | Discharge status: Home / Self Care | Payer: Medicare Other | Source: Ambulatory Visit | Attending: Orthopedic Surgery | Admitting: Orthopedic Surgery

## 2019-09-30 ENCOUNTER — Other Ambulatory Visit: Payer: Self-pay

## 2019-09-30 ENCOUNTER — Inpatient Hospital Stay: Admission: RE | Admit: 2019-09-30 | Payer: Medicare Other | Source: Ambulatory Visit

## 2019-09-30 ENCOUNTER — Other Ambulatory Visit: Payer: Medicare Other

## 2019-09-30 DIAGNOSIS — M25611 Stiffness of right shoulder, not elsewhere classified: Secondary | ICD-10-CM | POA: Diagnosis not present

## 2019-09-30 DIAGNOSIS — M7542 Impingement syndrome of left shoulder: Secondary | ICD-10-CM

## 2019-09-30 DIAGNOSIS — M25612 Stiffness of left shoulder, not elsewhere classified: Secondary | ICD-10-CM | POA: Diagnosis not present

## 2019-09-30 DIAGNOSIS — M7541 Impingement syndrome of right shoulder: Secondary | ICD-10-CM

## 2019-09-30 DIAGNOSIS — M25512 Pain in left shoulder: Secondary | ICD-10-CM | POA: Diagnosis not present

## 2019-09-30 DIAGNOSIS — M25511 Pain in right shoulder: Secondary | ICD-10-CM | POA: Diagnosis not present

## 2019-09-30 MED ORDER — IOPAMIDOL (ISOVUE-M 200) INJECTION 41%
20.0000 mL | Freq: Once | INTRAMUSCULAR | Status: AC
  Administered 2019-09-30: 20 mL via INTRA_ARTICULAR

## 2019-10-05 ENCOUNTER — Other Ambulatory Visit: Payer: Self-pay

## 2019-10-05 ENCOUNTER — Ambulatory Visit (INDEPENDENT_AMBULATORY_CARE_PROVIDER_SITE_OTHER): Payer: Medicare Other | Admitting: Orthopedic Surgery

## 2019-10-05 DIAGNOSIS — M75101 Unspecified rotator cuff tear or rupture of right shoulder, not specified as traumatic: Secondary | ICD-10-CM

## 2019-10-05 DIAGNOSIS — S46812A Strain of other muscles, fascia and tendons at shoulder and upper arm level, left arm, initial encounter: Secondary | ICD-10-CM

## 2019-10-05 DIAGNOSIS — S4382XA Sprain of other specified parts of left shoulder girdle, initial encounter: Secondary | ICD-10-CM

## 2019-10-05 DIAGNOSIS — M75102 Unspecified rotator cuff tear or rupture of left shoulder, not specified as traumatic: Secondary | ICD-10-CM

## 2019-10-05 MED ORDER — MELOXICAM 15 MG PO TABS
15.0000 mg | ORAL_TABLET | Freq: Every day | ORAL | 0 refills | Status: DC
Start: 2019-10-05 — End: 2019-11-17

## 2019-10-09 ENCOUNTER — Encounter: Payer: Self-pay | Admitting: Orthopedic Surgery

## 2019-10-09 NOTE — Progress Notes (Signed)
Office Visit Note   Patient: Derek Lowe           Date of Birth: [birth year]           MRN: [id number] Visit Date: 10/05/2019 Requested by: Suzan Garibaldi, [address] [place],  [place] [address] PCP: Suzan Garibaldi, FNP  Subjective: Chief Complaint  Patient presents with  . Follow-up    HPI: Derek Lowe is a 69 y.o. male who presents to the office complaining of bilateral shoulder pain.  His left shoulder is bothering him more than his right shoulder.  Pain is waking him up at night on occasion.  Pain is worse at night.  He denies any frank weakness of the shoulders bilaterally.  He presents to review his bilateral shoulder MRIs.  Left shoulder MRI revealed full-thickness partial width tear of the anterior leading edge of the supraspinatus with a partial thickness tear of the subscap as well as a small nondisplaced SLAP tear.  Right shoulder MRI revealed pinpoint full-thickness partial tear of the supraspinatus tendon as well as degenerative chondral thinning of the humeral head..                ROS:  All systems reviewed are negative as they relate to the chief complaint within the history of present illness.  Patient denies fevers or chills.  Assessment & Plan: Visit Diagnoses:  1. Tear of left supraspinatus tendon   2. Tear of right supraspinatus tendon   3. Partial tear of subscapularis tendon, left, initial encounter     Plan: Patient is a 69 year old male who presents complaining of bilateral shoulder pain.  His left shoulder bothers him more than his right.  Reviewed MRI hands of the bilateral shoulders.  Supraspinatus tears are present in both shoulders with a partial subscap tear in the left as well.  Does have some labral damage.  He wants to try conservative management as he is doing a lot of work outside and does not want to take time off from surgery if he can help it.  Plan to start physical therapy for rotator cuff strengthening.  Also prescribed meloxicam to help  with patient's pain.  Follow-up in 6 weeks for clinical recheck.  If no improvement, will consider surgery at that time.  Patient agree with plan.  Follow-Up Instructions: No follow-ups on file.   Orders:  No orders of the defined types were placed in this encounter.  Meds ordered this encounter  Medications  . meloxicam (MOBIC) 15 MG tablet    Sig: Take 1 tablet (15 mg total) by mouth daily.    Dispense:  30 tablet    Refill:  0      Procedures: No procedures performed   Clinical Data: No additional findings.  Objective: Vital Signs: There were no vitals taken for this visit.  Physical Exam:  Constitutional: Patient appears well-developed HEENT:  Head: Normocephalic Eyes:EOM are normal Neck: Normal range of motion Cardiovascular: Normal rate Pulmonary/chest: Effort normal Neurologic: Patient is alert Skin: Skin is warm Psychiatric: Patient has normal mood and affect  Ortho Exam:  Bilateral shoulder Exam Able to fully forward flex and abduct shoulder overhead No loss of ER relative to the other shoulder.  Good endpoint with ER Mild tenderness palpation of the bicipital groove bilaterally Good subscapularis, supraspinatus, and infraspinatus strength bilaterally.  Pain elicited with supraspinatus resistance testing bilaterally and subscapularis resistance testing on the left. Positive Hawkins impingement 5/5 grip strength, forearm pronation/supination, and bicep strength  Specialty  Comments:  No specialty comments available.  Imaging: No results found.   PMFS History: Patient Active Problem List   Diagnosis Date Noted  . Multinodular goiter 05/06/2017  . Hypothyroidism 05/06/2017   Past Medical History:  Diagnosis Date  . Hypothyroidism     Family History  Problem Relation Age of Onset  . Thyroid disease Sister     No past surgical history on file. Social History   Occupational History  . Not on file  Tobacco Use  . Smoking status: Never Smoker    . Smokeless tobacco: Current User    Types: Chew  Substance and Sexual Activity  . Alcohol use: Yes  . Drug use: No  . Sexual activity: Yes

## 2019-10-12 DIAGNOSIS — M6281 Muscle weakness (generalized): Secondary | ICD-10-CM | POA: Diagnosis not present

## 2019-10-12 DIAGNOSIS — M25511 Pain in right shoulder: Secondary | ICD-10-CM | POA: Diagnosis not present

## 2019-10-12 DIAGNOSIS — M25512 Pain in left shoulder: Secondary | ICD-10-CM | POA: Diagnosis not present

## 2019-10-17 DIAGNOSIS — M25511 Pain in right shoulder: Secondary | ICD-10-CM | POA: Diagnosis not present

## 2019-10-17 DIAGNOSIS — M25512 Pain in left shoulder: Secondary | ICD-10-CM | POA: Diagnosis not present

## 2019-10-17 DIAGNOSIS — M6281 Muscle weakness (generalized): Secondary | ICD-10-CM | POA: Diagnosis not present

## 2019-10-19 DIAGNOSIS — M6281 Muscle weakness (generalized): Secondary | ICD-10-CM | POA: Diagnosis not present

## 2019-10-19 DIAGNOSIS — M25512 Pain in left shoulder: Secondary | ICD-10-CM | POA: Diagnosis not present

## 2019-10-19 DIAGNOSIS — M25511 Pain in right shoulder: Secondary | ICD-10-CM | POA: Diagnosis not present

## 2019-10-24 DIAGNOSIS — M25511 Pain in right shoulder: Secondary | ICD-10-CM | POA: Diagnosis not present

## 2019-10-24 DIAGNOSIS — M6281 Muscle weakness (generalized): Secondary | ICD-10-CM | POA: Diagnosis not present

## 2019-10-24 DIAGNOSIS — M25512 Pain in left shoulder: Secondary | ICD-10-CM | POA: Diagnosis not present

## 2019-10-27 DIAGNOSIS — M25512 Pain in left shoulder: Secondary | ICD-10-CM | POA: Diagnosis not present

## 2019-10-27 DIAGNOSIS — M6281 Muscle weakness (generalized): Secondary | ICD-10-CM | POA: Diagnosis not present

## 2019-10-27 DIAGNOSIS — M25511 Pain in right shoulder: Secondary | ICD-10-CM | POA: Diagnosis not present

## 2019-11-01 DIAGNOSIS — M6281 Muscle weakness (generalized): Secondary | ICD-10-CM | POA: Diagnosis not present

## 2019-11-01 DIAGNOSIS — M25511 Pain in right shoulder: Secondary | ICD-10-CM | POA: Diagnosis not present

## 2019-11-01 DIAGNOSIS — M25512 Pain in left shoulder: Secondary | ICD-10-CM | POA: Diagnosis not present

## 2019-11-02 DIAGNOSIS — M25511 Pain in right shoulder: Secondary | ICD-10-CM | POA: Diagnosis not present

## 2019-11-02 DIAGNOSIS — M6281 Muscle weakness (generalized): Secondary | ICD-10-CM | POA: Diagnosis not present

## 2019-11-02 DIAGNOSIS — M25512 Pain in left shoulder: Secondary | ICD-10-CM | POA: Diagnosis not present

## 2019-11-08 DIAGNOSIS — M6281 Muscle weakness (generalized): Secondary | ICD-10-CM | POA: Diagnosis not present

## 2019-11-08 DIAGNOSIS — M25511 Pain in right shoulder: Secondary | ICD-10-CM | POA: Diagnosis not present

## 2019-11-08 DIAGNOSIS — M25512 Pain in left shoulder: Secondary | ICD-10-CM | POA: Diagnosis not present

## 2019-11-10 DIAGNOSIS — M25511 Pain in right shoulder: Secondary | ICD-10-CM | POA: Diagnosis not present

## 2019-11-10 DIAGNOSIS — M6281 Muscle weakness (generalized): Secondary | ICD-10-CM | POA: Diagnosis not present

## 2019-11-10 DIAGNOSIS — M25512 Pain in left shoulder: Secondary | ICD-10-CM | POA: Diagnosis not present

## 2019-11-14 DIAGNOSIS — M6281 Muscle weakness (generalized): Secondary | ICD-10-CM | POA: Diagnosis not present

## 2019-11-14 DIAGNOSIS — M25512 Pain in left shoulder: Secondary | ICD-10-CM | POA: Diagnosis not present

## 2019-11-14 DIAGNOSIS — M25511 Pain in right shoulder: Secondary | ICD-10-CM | POA: Diagnosis not present

## 2019-11-16 ENCOUNTER — Ambulatory Visit (INDEPENDENT_AMBULATORY_CARE_PROVIDER_SITE_OTHER): Payer: Medicare Other | Admitting: Orthopedic Surgery

## 2019-11-16 ENCOUNTER — Ambulatory Visit: Payer: Self-pay

## 2019-11-16 DIAGNOSIS — S4382XA Sprain of other specified parts of left shoulder girdle, initial encounter: Secondary | ICD-10-CM | POA: Diagnosis not present

## 2019-11-16 DIAGNOSIS — M25531 Pain in right wrist: Secondary | ICD-10-CM | POA: Diagnosis not present

## 2019-11-16 DIAGNOSIS — M25512 Pain in left shoulder: Secondary | ICD-10-CM | POA: Diagnosis not present

## 2019-11-16 DIAGNOSIS — M19039 Primary osteoarthritis, unspecified wrist: Secondary | ICD-10-CM | POA: Diagnosis not present

## 2019-11-16 DIAGNOSIS — M25532 Pain in left wrist: Secondary | ICD-10-CM | POA: Diagnosis not present

## 2019-11-16 DIAGNOSIS — M75102 Unspecified rotator cuff tear or rupture of left shoulder, not specified as traumatic: Secondary | ICD-10-CM

## 2019-11-16 DIAGNOSIS — S46812A Strain of other muscles, fascia and tendons at shoulder and upper arm level, left arm, initial encounter: Secondary | ICD-10-CM

## 2019-11-16 DIAGNOSIS — M25511 Pain in right shoulder: Secondary | ICD-10-CM | POA: Diagnosis not present

## 2019-11-16 DIAGNOSIS — M6281 Muscle weakness (generalized): Secondary | ICD-10-CM | POA: Diagnosis not present

## 2019-11-17 ENCOUNTER — Encounter: Payer: Self-pay | Admitting: Orthopedic Surgery

## 2019-11-17 MED ORDER — MELOXICAM 15 MG PO TABS: 15.0000 mg | ORAL_TABLET | ORAL | 0 refills | Status: DC

## 2019-11-17 NOTE — Progress Notes (Signed)
Office Visit Note   Patient: Derek Lowe           Date of Birth: [birth year]           MRN: [id number] Visit Date: 11/16/2019 Requested by: Suzan Garibaldi, [address] [place],  [place] [address] PCP: Suzan Garibaldi, FNP  Subjective: Chief Complaint  Patient presents with  . Right Shoulder - Follow-up  . Left Shoulder - Follow-up    HPI: Derek Lowe is a 69 y.o. male who presents to the office complaining of bilateral shoulder pain.  He presents for 6-week follow-up.  He had partial tear of supraspinatus of the right shoulder as well as partial tears of the supraspinatus and subscapularis in the left shoulder.  He has been going to physical therapy as he wants to avoid downtime from surgical recovery.  He notes PT has helped and "fixed the right shoulder".  His left shoulder is now the only concern.  This is improved compared with his previous visit but it continues to feel weak at times and wake him up at night.  He continues to want to avoid surgery.    Patient also complains of 4 weeks of bilateral wrist pain.  Left wrist bothers him the same as his right wrist.  He localizes pain to the dorsal wrist between the distal radius and ulna.  Pain is worse with extension and pushing up with his wrist.  No pain with flexion/pronation/supination.  Denies any numbness or tingling.  Denies any significant swelling or any recent injuries.              ROS:  All systems reviewed are negative as they relate to the chief complaint within the history of present illness.  Patient denies fevers or chills.  Assessment & Plan: Visit Diagnoses:  1. Pain in left wrist   2. Pain in right wrist   3. Arthritis, wrist   4. Partial tear of subscapularis tendon, left, initial encounter   5. Tear of left supraspinatus tendon     Plan:  Patient is a 68 year old male who presents complaining of primarily left shoulder pain as well as bilateral wrist pain.  Left shoulder pain continues to improve with  physical therapy.  He has good strength on exam but he does have pain that is elicited with resistance testing.  He continues to make weekly improvement and wants to continue physical therapy.  Plan to continue physical therapy for 4 more weeks with the left shoulder.  He will also take Mobic every other day as needed.  Right shoulder is doing well so recommend physical therapy to focus on the left shoulder.  Patient also complains of bilateral wrist pain.  He has no injury onset with gradual onset and worsening of the bilateral wrist pain.  Pain is worse with extension.  Radiographs taken today reveal arthritic changes of the DRUJ as well as the radiocarpal joint.  He is tender diffusely throughout the wrist.  Offered injection today but patient wants to try anti-inflammatory medication and topical Voltaren.  Plan to follow-up in 6 weeks for clinical recheck.  If wrist pain does not improved will consider wrist injection.  Patient agreed with plan.  Follow-Up Instructions: No follow-ups on file.   Orders:  Orders Placed This Encounter  Procedures  . XR Wrist Complete Left  . XR Wrist Complete Right   No orders of the defined types were placed in this encounter.     Procedures: No procedures performed  Clinical Data: No additional findings.  Objective: Vital Signs: There were no vitals taken for this visit.  Physical Exam:  Constitutional: Patient appears well-developed HEENT:  Head: Normocephalic Eyes:EOM are normal Neck: Normal range of motion Cardiovascular: Normal rate Pulmonary/chest: Effort normal Neurologic: Patient is alert Skin: Skin is warm Psychiatric: Patient has normal mood and affect  Ortho Exam:  Left shoulder Exam Able to fully forward flex and abduct shoulder overhead No loss of ER relative to the other shoulder.  Good endpoint with ER No TTP over the [facility] joint or bicipital groove  Good subscapularis, supraspinatus, and infraspinatus strength.  Pain  elicited with supraspinatus and subscapularis resistance testing. Positive Hawkins impingement  5/5 grip strength, forearm pronation/supination, and bicep strength  Bilateral wrist exam. Tenderness to palpation over the bilateral wrists dorsally in the 3-4 portal and the 4-5 portal.  No significant tenderness to palpation over the ulnar fovea, first compartment, anatomic snuffbox, first CMC joint.  No tenderness to palpation over the A1 pulleys.  No pain with thumb circumduction or adduction/abduction of the thumb.  Negative Finkelstein's test.  No pain elicited with wrist flexion, pronation, supination.  No loss of range of motion.  No significant swelling observed.  Pain elicited with wrist extension.  Specialty Comments:  No specialty comments available.  Imaging: No results found.   PMFS History: Patient Active Problem List   Diagnosis Date Noted  . Multinodular goiter 05/06/2017  . Hypothyroidism 05/06/2017   Past Medical History:  Diagnosis Date  . Hypothyroidism     Family History  Problem Relation Age of Onset  . Thyroid disease Sister     No past surgical history on file. Social History   Occupational History  . Not on file  Tobacco Use  . Smoking status: Never Smoker  . Smokeless tobacco: Current User    Types: Chew  Substance and Sexual Activity  . Alcohol use: Yes  . Drug use: No  . Sexual activity: Yes

## 2019-12-21 ENCOUNTER — Encounter: Payer: Self-pay | Admitting: Orthopedic Surgery

## 2019-12-21 ENCOUNTER — Ambulatory Visit (INDEPENDENT_AMBULATORY_CARE_PROVIDER_SITE_OTHER): Payer: Medicare Other | Admitting: Orthopedic Surgery

## 2019-12-21 DIAGNOSIS — M75102 Unspecified rotator cuff tear or rupture of left shoulder, not specified as traumatic: Secondary | ICD-10-CM

## 2019-12-21 DIAGNOSIS — M75101 Unspecified rotator cuff tear or rupture of right shoulder, not specified as traumatic: Secondary | ICD-10-CM

## 2019-12-21 NOTE — Progress Notes (Signed)
Office Visit Note   Patient: Derek Lowe           Date of Birth: [birth year]           MRN: [id number] Visit Date: 12/21/2019 Requested by: Suzan Garibaldi, [place],  [place] [address] PCP: Suzan Garibaldi, FNP  Subjective: Chief Complaint  Patient presents with  . 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  Decision today was for or against wrist injections.  He is doing better with the wrist using topicals.  Still has good and bad days with the shoulders.  Right shoulder MRI that showed punctate full-thickness tear of the supraspinatus on as well as a SLAP tear.  On the left-hand side there was a bigger rotator cuff tear but still not with much retraction.  He does have a history of injections as well as doing therapy with a home exercise program.  Has some pain in the morning.  He is able to work it out.              ROS: All systems reviewed are negative as they relate to the chief complaint within the history of present illness.  Patient denies  fevers or chills.   Assessment & Plan: Visit Diagnoses:  1. Tear of left supraspinatus tendon   2. Tear of right supraspinatus tendon     Plan: Impression is bilateral shoulder pain with small tears which I think we can watch for now he wants to manage the problem.  Alternatively surgery could be considered but he wants to hold off on that which is understandable.  Unlikely that the left shoulder rotator cuff tear will "heal".  It will likely gradually get bigger over time.  Cautioned him against using his arm for overhead lifting as well as lifting out away from his body.  He will follow-up if he wants to get more injections for pain relief or if he wants to consider surgical intervention.  Follow-Up Instructions: Return if symptoms worsen or fail to improve.   Orders:  No orders of the defined types were placed in this encounter.  No orders of  the defined types were placed in this encounter.     Procedures: No procedures performed   Clinical Data: No additional findings.  Objective: Vital Signs: There were no vitals taken for this visit.  Physical Exam:   Constitutional: Patient appears well-developed HEENT:  Head: Normocephalic Eyes:EOM are normal Neck: Normal range of motion Cardiovascular: Normal rate Pulmonary/chest: Effort normal Neurologic: Patient is alert Skin: Skin is warm Psychiatric: Patient has normal mood and affect    Ortho Exam: Ortho exam demonstrates full cervical spine range of motion.  He has full active and passive range of motion of both shoulders with no restriction of external rotation on either side.  Both have external rotation to about 50 degrees.  Supraspinatus infraspinatus subscap strength is pretty good 5+ out of 5 bilaterally.  Today he is get little coarse grinding on the left with internal extra rotation at 90 degrees of abduction.  Right shoulder passive range of motion is pretty seamless.  O'Brien's testing equivocal on the right negative on the left.  No AC joint tenderness bilaterally.  Specialty Comments:  No specialty comments available.  Imaging: No results found.   PMFS History: Patient Active Problem List   Diagnosis Date Noted  . Multinodular goiter 05/06/2017  . Hypothyroidism 05/06/2017  Past Medical History:  Diagnosis Date  . Hypothyroidism     Family History  Problem Relation Age of Onset  . Thyroid disease Sister     History reviewed. No pertinent surgical history. Social History   Occupational History  . Not on file  Tobacco Use  . Smoking status: Never Smoker  . Smokeless tobacco: Current User    Types: Chew  Substance and Sexual Activity  . Alcohol use: Yes  . Drug use: No  . Sexual activity: Yes

## 2020-02-15 ENCOUNTER — Ambulatory Visit (INDEPENDENT_AMBULATORY_CARE_PROVIDER_SITE_OTHER): Payer: Medicare Other | Admitting: Endocrinology

## 2020-02-15 ENCOUNTER — Other Ambulatory Visit: Payer: Self-pay

## 2020-02-15 ENCOUNTER — Encounter: Payer: Self-pay | Admitting: Endocrinology

## 2020-02-15 VITALS — HR 82 | Ht 68.5 in | Wt 193.0 lb

## 2020-02-15 DIAGNOSIS — E039 Hypothyroidism, unspecified: Secondary | ICD-10-CM

## 2020-02-15 DIAGNOSIS — E042 Nontoxic multinodular goiter: Secondary | ICD-10-CM

## 2020-02-15 LAB — T4, FREE: Free T4: 0.88 ng/dL (ref 0.60–1.60)

## 2020-02-15 LAB — TSH: TSH: 3.02 u[IU]/mL (ref 0.35–4.50)

## 2020-02-15 NOTE — Patient Instructions (Addendum)
Blood tests are requested for you today.  We'll let you know about the results.  Let's recheck the ultrasound.  you will receive a phone call, about a day and time for an appointment. Please come back for a follow-up appointment in 1 year.    

## 2020-02-15 NOTE — Progress Notes (Signed)
Subjective:    Patient ID: Derek Lowe, male    DOB: Apr 14, 1951, 69 y.o.   MRN: [id number]  HPI Pt returns for f/u hypothyroidism (dx'ed 2018; he has been on synthroid since then; [name] in [place] showed multinodular goiter; he did not tolerate synthroid 25 mcg, (itching), so he takes 1/2 of a 50 mcg QD; f/u US in 2020 showed LLP nodule needs bx--this showed beth cat 1; he declined f/u bx).  pt states he feels well in general.  He takes synthroid as rx'ed.   Past Medical History:  Diagnosis Date  . Hypothyroidism     No past surgical history on file.  Social History   Socioeconomic History  . Marital status: Married    Spouse name: Not on file  . Number of children: Not on file  . Years of education: Not on file  . Highest education level: Not on file  Occupational History  . Not on file  Tobacco Use  . Smoking status: Never Smoker  . Smokeless tobacco: Current User    Types: Chew  Substance and Sexual Activity  . Alcohol use: Yes  . Drug use: No  . Sexual activity: Yes  Other Topics Concern  . Not on file  Social History Narrative  . Not on file   Social Determinants of Health   Financial Resource Strain:   . Difficulty of Paying Living Expenses: Not on file  Food Insecurity:   . Worried About Charity fundraiser in the Last Year: Not on file  . Ran Out of Food in the Last Year: Not on file  Transportation Needs:   . Lack of Transportation (Medical): Not on file  . Lack of Transportation (Non-Medical): Not on file  Physical Activity:   . Days of Exercise per Week: Not on file  . Minutes of Exercise per Session: Not on file  Stress:   . Feeling of Stress : Not on file  Social Connections:   . Frequency of Communication with Friends and Family: Not on file  . Frequency of Social Gatherings with Friends and Family: Not on file  . Attends Religious Services: Not on file  . Active Member of Clubs or Organizations: Not on file  . Attends Archivist  Meetings: Not on file  . Marital Status: Not on file  Intimate Partner Violence:   . Fear of Current or Ex-Partner: Not on file  . Emotionally Abused: Not on file  . Physically Abused: Not on file  . Sexually Abused: Not on file    Current Outpatient Medications on File Prior to Visit  Medication Sig Dispense Refill  . ferrous sulfate 324 MG TBEC Take 324 mg by mouth.    . levothyroxine (SYNTHROID) 50 MCG tablet TAKE 1/2 TABLET BY MOUTH DAILY BEFORE BREAKFAST 45 tablet 4  . meloxicam (MOBIC) 15 MG tablet Take 15 mg by mouth daily.    . meloxicam (MOBIC) 15 MG tablet Take 1 tablet (15 mg total) by mouth every other day. 30 tablet 0  . Omega-3 Fatty Acids (FISH OIL) 1000 MG CAPS Take by mouth.    . triamcinolone cream (KENALOG) 0.1 % APPLY TO AFFECTED AREA(S) 3 TIMES DAILY AS NEEDED     No current facility-administered medications on file prior to visit.    No Known Allergies  Family History  Problem Relation Age of Onset  . Thyroid disease Sister     Pulse 82   Ht 5' 8.5" (1.74 m)   Wt  193 lb (87.5 kg)   SpO2 92%   BMI 28.92 kg/m    Review of Systems     Objective:   Physical Exam VITAL SIGNS:  See vs page GENERAL: no distress NECK: thyroid is slightly enlarged, but multinodular surface.   Lab Results  Component Value Date   TSH 3.02 02/15/2020      Assessment & Plan:  Hypothyroidism: well-replaced.  Please continue the same synthroid

## 2020-02-16 ENCOUNTER — Telehealth: Payer: Self-pay

## 2020-02-16 NOTE — Telephone Encounter (Signed)
-----   Message from Renato Shin, MD sent at 02/15/2020  6:27 PM EDT ----- please contact patient: Normal.

## 2020-02-16 NOTE — Telephone Encounter (Signed)
RESULTS  Results were reviewed by Dr. Ellison. A letter has been mailed to pt home address. For future reference, letter can be found in Epic. 

## 2020-02-27 ENCOUNTER — Ambulatory Visit
Admission: RE | Admit: 2020-02-27 | Discharge: 2020-02-27 | Disposition: A | Discharge status: Home / Self Care | Payer: Medicare Other | Source: Ambulatory Visit | Attending: Endocrinology | Admitting: Endocrinology

## 2020-02-27 DIAGNOSIS — E041 Nontoxic single thyroid nodule: Secondary | ICD-10-CM | POA: Diagnosis not present

## 2020-02-27 DIAGNOSIS — E042 Nontoxic multinodular goiter: Secondary | ICD-10-CM

## 2020-02-28 ENCOUNTER — Telehealth: Payer: Self-pay

## 2020-02-28 NOTE — Telephone Encounter (Signed)
-----   Message from Renato Shin, MD sent at 02/27/2020  5:16 PM EDT ----- please contact patient: All you need to do is to recheck in 1 year.  I'll see you next time.

## 2020-02-28 NOTE — Telephone Encounter (Signed)
Left message for patient to call back regarding results.

## 2020-03-02 ENCOUNTER — Telehealth: Payer: Self-pay

## 2020-03-02 NOTE — Telephone Encounter (Signed)
-----   Message from Renato Shin, MD sent at 02/27/2020  5:16 PM EDT ----- please contact patient: All you need to do is to recheck in 1 year.  I'll see you next time.

## 2020-03-02 NOTE — Telephone Encounter (Signed)
Left detailed message for patient regarding results and recommendations. 

## 2020-03-26 ENCOUNTER — Telehealth (HOSPITAL_COMMUNITY): Payer: Self-pay | Admitting: Family

## 2020-03-26 DIAGNOSIS — Z23 Encounter for immunization: Secondary | ICD-10-CM | POA: Diagnosis not present

## 2020-03-26 DIAGNOSIS — U071 COVID-19: Secondary | ICD-10-CM | POA: Diagnosis not present

## 2020-03-26 DIAGNOSIS — R69 Illness, unspecified: Secondary | ICD-10-CM

## 2020-03-26 NOTE — Telephone Encounter (Signed)
Called to discuss with Dimas Alexandria about Covid symptoms and potential candidacy for the use of casirivimab/imdevimab, a combination monoclonal antibody infusion for those with mild to moderate Covid symptoms and at a high risk of hospitalization.     Pt is qualified for this infusion at the infusion center due to co-morbid conditions and/or a member of an at-risk group, however unable to reach patient.VM left.   Shonte Soderlund,NP

## 2020-08-09 ENCOUNTER — Other Ambulatory Visit: Payer: Self-pay | Admitting: Endocrinology

## 2020-08-09 ENCOUNTER — Encounter: Payer: Self-pay | Admitting: Gastroenterology

## 2020-08-09 DIAGNOSIS — E039 Hypothyroidism, unspecified: Secondary | ICD-10-CM

## 2020-08-16 ENCOUNTER — Other Ambulatory Visit: Payer: Self-pay | Admitting: Endocrinology

## 2020-08-16 ENCOUNTER — Other Ambulatory Visit: Payer: Self-pay | Admitting: Surgical

## 2020-08-16 DIAGNOSIS — E039 Hypothyroidism, unspecified: Secondary | ICD-10-CM

## 2020-08-16 NOTE — Telephone Encounter (Signed)
Please advise 

## 2020-08-16 NOTE — Telephone Encounter (Signed)
MEDICATION: levothyroxine  PHARMACY:   [id number] [address], [place] - [place] Phone:  [phone]  Fax:  [phone]      HAS THE PATIENT CONTACTED THEIR PHARMACY?  yes  IS THIS A 90 DAY SUPPLY : no  IS PATIENT OUT OF MEDICATION: yes  IF NOT; HOW MUCH IS LEFT:   LAST APPOINTMENT DATE: @3 /03/2021  NEXT APPOINTMENT DATE:@9 /21/22  DO WE HAVE YOUR PERMISSION TO LEAVE A DETAILED MESSAGE?:  OTHER COMMENTS:    **Let patient know to contact pharmacy at the end of the day to make sure medication is ready. **  ** Please notify patient to allow 48-72 hours to process**  **Encourage patient to contact the pharmacy for refills or they can request refills through [facility]**

## 2020-09-13 ENCOUNTER — Ambulatory Visit (AMBULATORY_SURGERY_CENTER): Payer: Medicare Other

## 2020-09-13 ENCOUNTER — Other Ambulatory Visit: Payer: Self-pay

## 2020-09-13 VITALS — Ht 68.5 in | Wt 195.0 lb

## 2020-09-13 DIAGNOSIS — Z1211 Encounter for screening for malignant neoplasm of colon: Secondary | ICD-10-CM

## 2020-09-13 MED ORDER — SUTAB 1479-225-188 MG PO TABS: 12.0000 | ORAL_TABLET | ORAL | 0 refills | Status: DC

## 2020-09-13 NOTE — Progress Notes (Signed)
Pt verified name, DOB, address and insurance during PV today.   Pt mailed instruction packet to include copy of consent form to read and not return, and instructions. Sutab coupon mailed in packet. PV completed over the phone. Pt encouraged to call with questions or issues.   No allergies to soy or egg Pt is not on blood thinners or diet pills Issues with sedation/intubation not assessed for surgeries-has only had sedation for colonoscopy Denies atrial flutter/fib Denies constipation    Pt is aware of Covid safety and care partner requirements.

## 2020-09-17 IMAGING — US US THYROID
1 series · 13 of 25 positions shown · non-contrast
Comparison: Prior thyroid ultrasound 07/27/2018

CLINICAL DATA: Goiter. 68-year-old male with a history of
multinodular goiter. Patient previously underwent biopsy of the left
inferior thyroid nodule on 08/03/2018

EXAM:
THYROID ULTRASOUND
TECHNIQUE: Ultrasound examination of the thyroid gland and adjacent soft
tissues was performed.

[Series 1: us thyroid · 0.06mm/px · 13 of 43 slices shown]
[im 1/43]
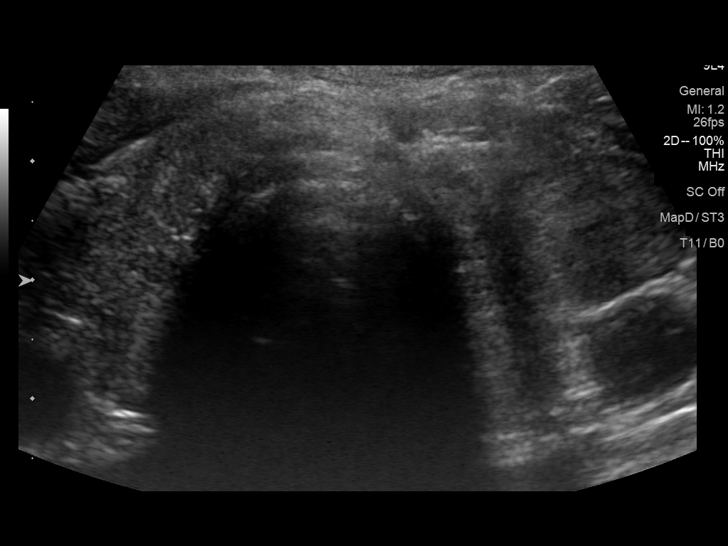
[im 4/43]
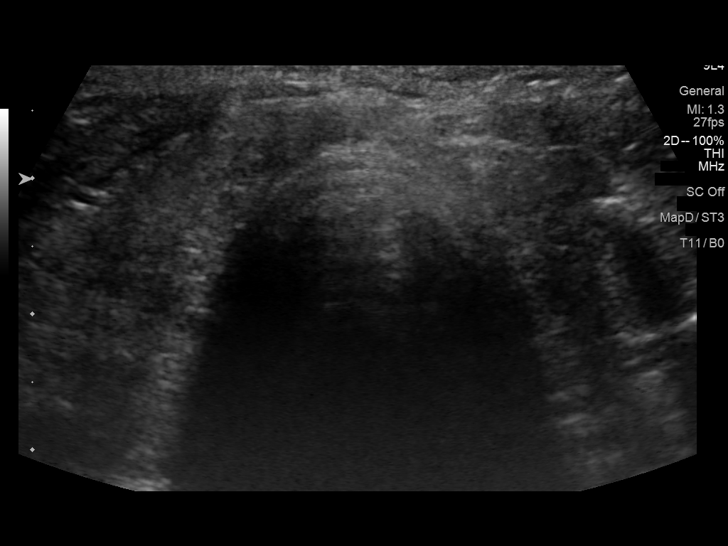
[im 8/43]
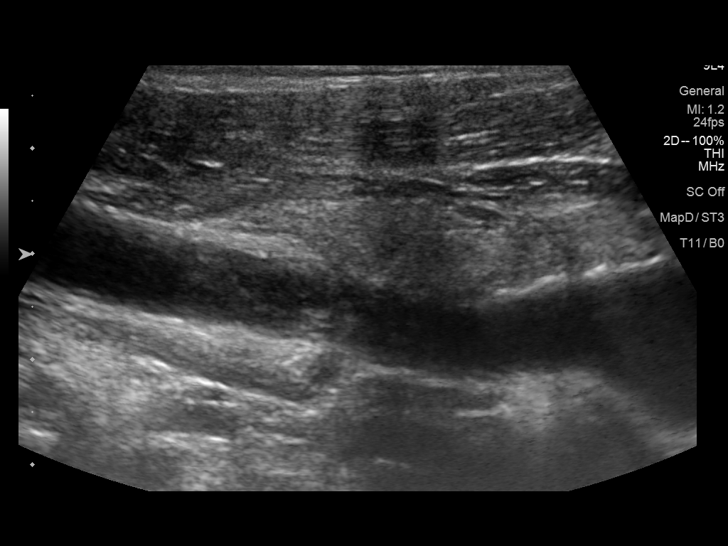
[im 11/43]
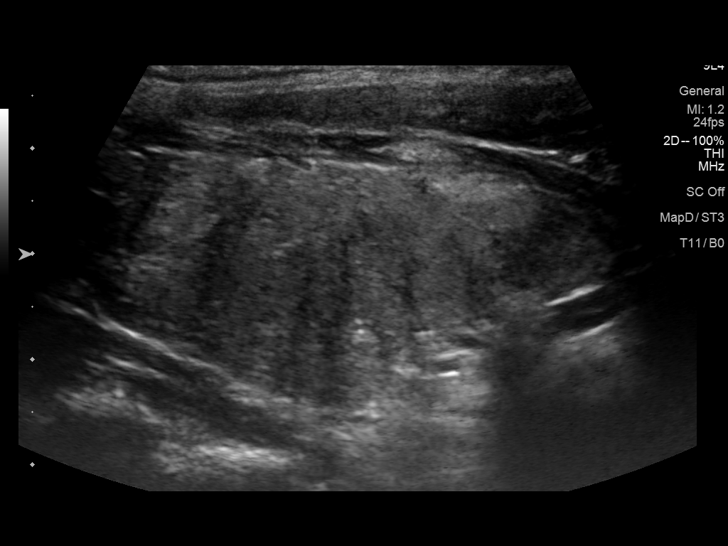
[im 15/43]
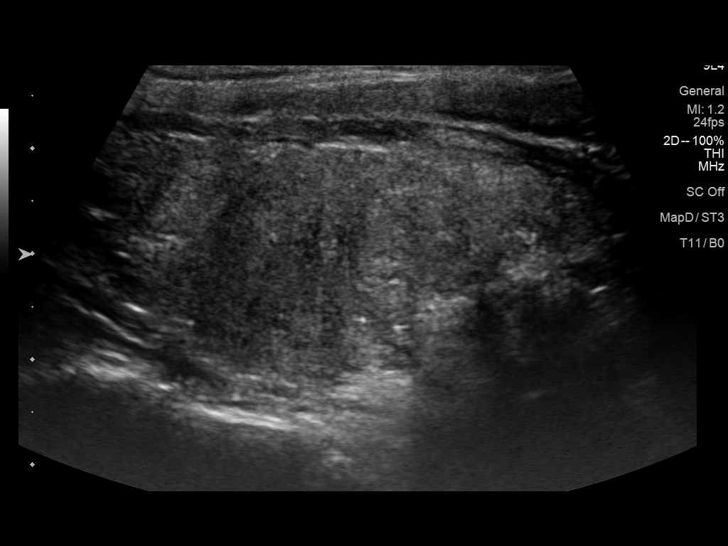
[im 18/43]
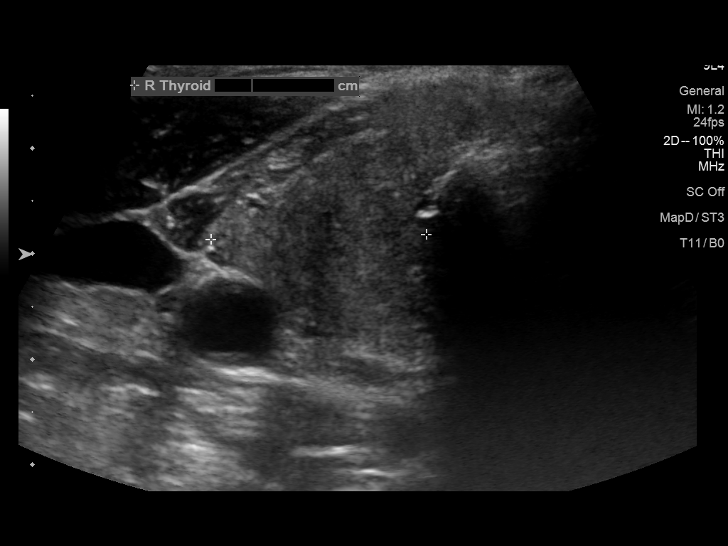
[im 22/43]
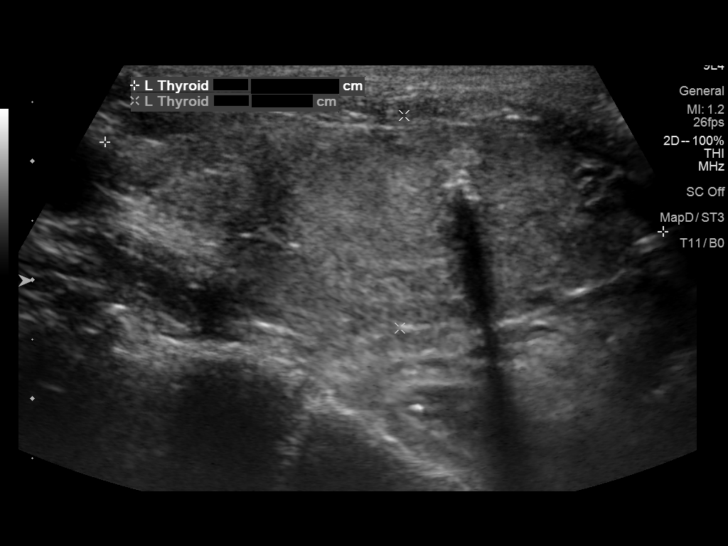
[im 25/43]
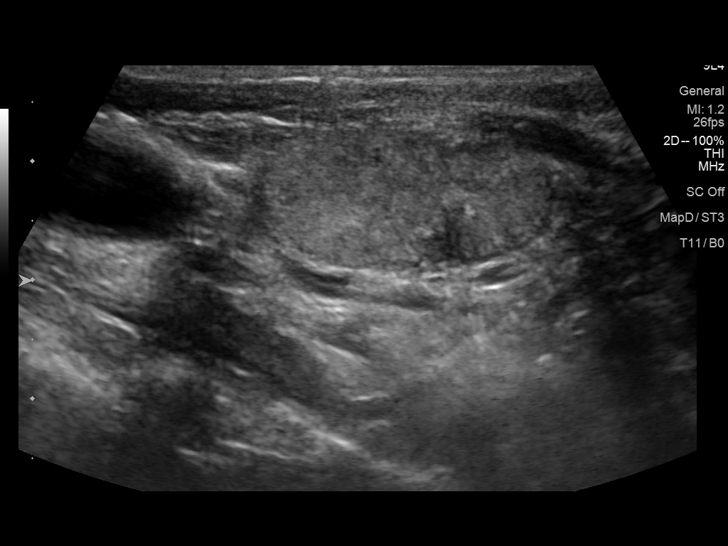
[im 29/43]
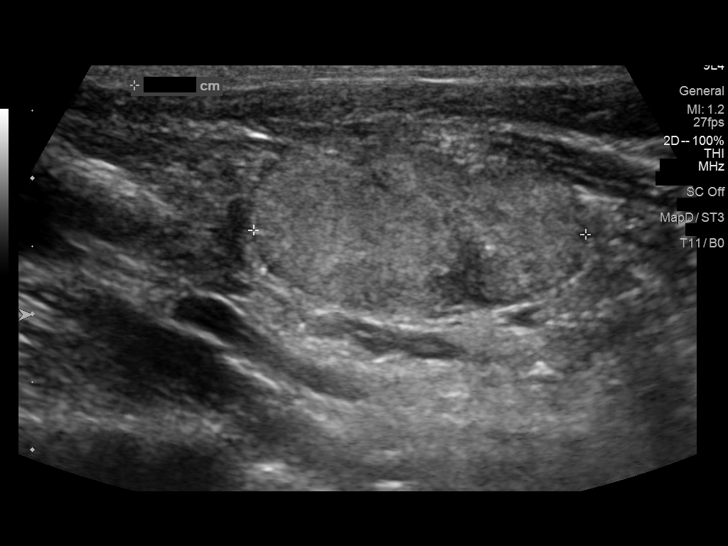
[im 32/43]
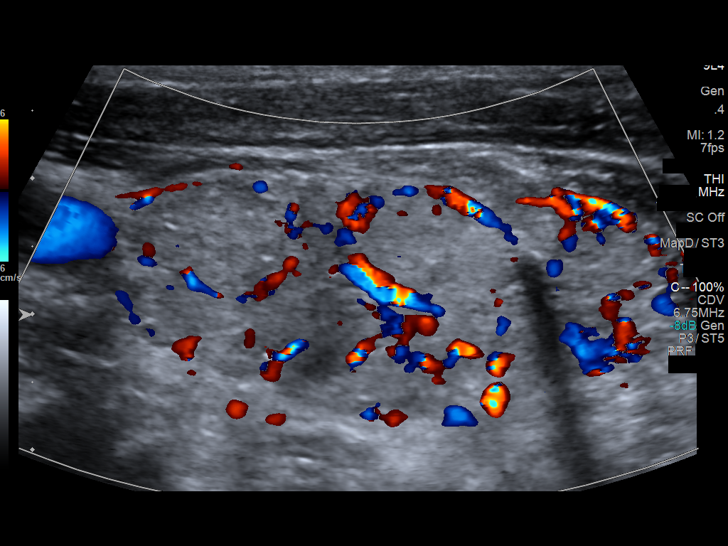
[im 36/43]
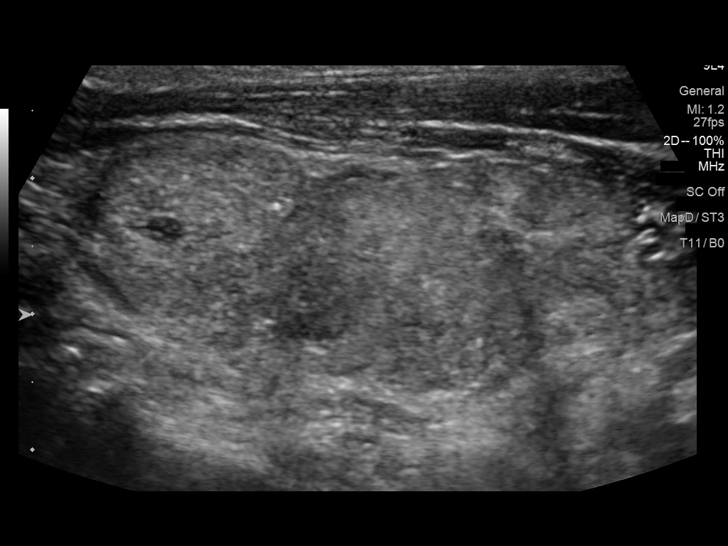
[im 39/43]
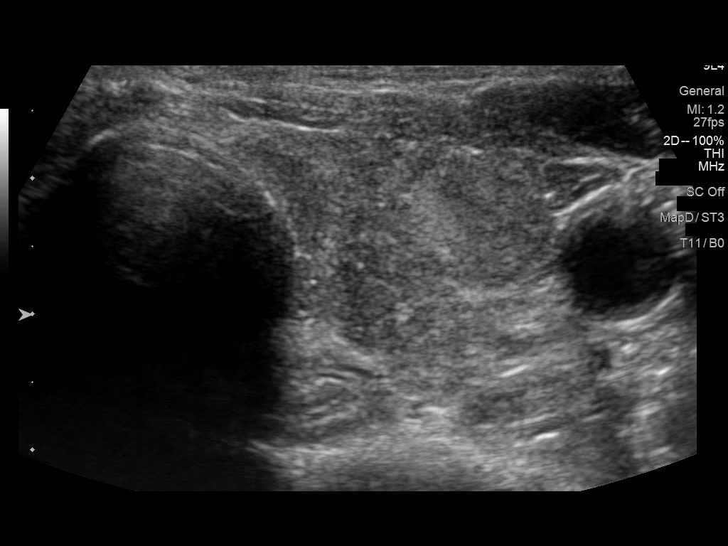
[im 43/43]
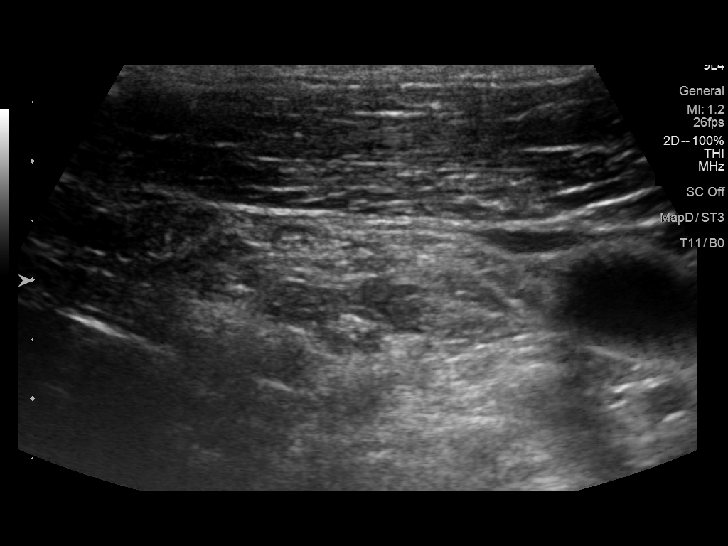

[13 of 25 positions shown; findings below may reference images not displayed]

FINDINGS: Parenchymal Echotexture: Markedly heterogenous

Isthmus: 0.4 cm

Right lobe: 5.3 x 1.9 x 2.0 cm

Left lobe: 4.8 x 1.8 x 2.1 cm

_________________________________________________________

Estimated total number of nodules >/= 1 cm: 3

Number of spongiform nodules >/=  2 cm not described below (TR1): 0

Number of mixed cystic and solid nodules >/= 1.5 cm not described
below (TR2): 0

_________________________________________________________

Nodule # 1: No significant interval change in the size or appearance
of the 1.3 cm isoechoic solid nodule in the right inferior gland. As
previously noted, this nodule does not meet criteria for biopsy or
dedicated imaging follow-up.

_________________________________________________________

Nodule # 2: No significant interval change in the size or appearance
of the 1.4 cm isoechoic solid nodule in the left superior gland. As
previously noted, this nodule does not meet criteria for biopsy or
dedicated imaging follow-up.

_________________________________________________________

Nodule # 3: No significant interval change in the size or appearance
of the 2.5 x 1.8 x 1.1 cm nodule in the left inferior gland. This
nodule was previously biopsied in Wednesday August, 2018.

_________________________________________________________
IMPRESSION: 1. No significant interval change in the size or appearance of the
previously biopsied 2.5 cm nodule in the left inferior gland.
2. Additional small bilateral thyroid nodules also remain unchanged
and do not meet criteria to warrant further dedicated evaluation.

The above is in keeping with the ACR TI-RADS recommendations - [HOSPITAL] 2737;[DATE].

## 2020-09-18 DIAGNOSIS — H40033 Anatomical narrow angle, bilateral: Secondary | ICD-10-CM | POA: Diagnosis not present

## 2020-09-18 DIAGNOSIS — H2513 Age-related nuclear cataract, bilateral: Secondary | ICD-10-CM | POA: Diagnosis not present

## 2020-10-01 ENCOUNTER — Encounter: Payer: Medicare Other | Admitting: Gastroenterology

## 2021-02-20 ENCOUNTER — Ambulatory Visit: Payer: Medicare Other | Admitting: Endocrinology

## 2021-03-20 ENCOUNTER — Ambulatory Visit (INDEPENDENT_AMBULATORY_CARE_PROVIDER_SITE_OTHER): Payer: Medicare Other | Admitting: Endocrinology

## 2021-03-20 ENCOUNTER — Other Ambulatory Visit: Payer: Self-pay

## 2021-03-20 VITALS — BP 110/44 | HR 80 | Ht 68.5 in | Wt 193.8 lb

## 2021-03-20 DIAGNOSIS — E039 Hypothyroidism, unspecified: Secondary | ICD-10-CM

## 2021-03-20 LAB — T4, FREE: Free T4: 0.74 ng/dL (ref 0.60–1.60)

## 2021-03-20 LAB — TSH: TSH: 3.13 u[IU]/mL (ref 0.35–5.50)

## 2021-03-20 NOTE — Patient Instructions (Signed)
Blood tests are requested for you today.  We'll let you know about the results.   We'll skip the ultrasound this year Please come back for a follow-up appointment in 1 year.

## 2021-03-20 NOTE — Progress Notes (Signed)
Subjective:    Patient ID: Derek Lowe, male    DOB: 07-21-1950, 70 y.o.   MRN: [id number]  HPI Pt returns for f/u hypothyroidism (dx'ed 2018; he has been on synthroid since then; [name] in [place] showed multinodular goiter; he did not tolerate synthroid 25 mcg, (itching), so he takes 1/2 of a 50 mcg QD; f/u US in 2020 showed LLP nodule needs bx--this showed beth cat 1; he declined f/u bx).  pt states he feels well in general.  He takes synthroid as rx'ed.   Past Medical History:  Diagnosis Date   Arthritis    Hypothyroidism     Past Surgical History:  Procedure Laterality Date   COLONOSCOPY  2011    Social History   Socioeconomic History   Marital status: Married    Spouse name: Not on file   Number of children: Not on file   Years of education: Not on file   Highest education level: Not on file  Occupational History   Not on file  Tobacco Use   Smoking status: Never   Smokeless tobacco: Former    Types: Nurse, children's Use: Never used  Substance and Sexual Activity   Alcohol use: Yes   Drug use: No   Sexual activity: Yes  Other Topics Concern   Not on file  Social History Narrative   Not on file   Social Determinants of Health   Financial Resource Strain: Not on file  Food Insecurity: Not on file  Transportation Needs: Not on file  Physical Activity: Not on file  Stress: Not on file  Social Connections: Not on file  Intimate Partner Violence: Not on file    Current Outpatient Medications on File Prior to Visit  Medication Sig Dispense Refill   ferrous sulfate 324 MG TBEC Take 324 mg by mouth.     levothyroxine (SYNTHROID) 50 MCG tablet TAKE 1/2 TABLET BY MOUTH DAILY BEFORE BREAKFAST 45 tablet 4   meloxicam (MOBIC) 15 MG tablet TAKE 1 TABLET (15 MG TOTAL) BY MOUTH EVERY OTHER DAY. 30 tablet 0   Omega-3 Fatty Acids (FISH OIL) 1000 MG CAPS Take by mouth.     Sodium Sulfate-Mag Sulfate-KCl (SUTAB) [id number] MG TABS Take 12 tablets by mouth as  directed. 24 tablet 0   triamcinolone cream (KENALOG) 0.1 % APPLY TO AFFECTED AREA(S) 3 TIMES DAILY AS NEEDED     No current facility-administered medications on file prior to visit.    No Known Allergies  Family History  Problem Relation Age of Onset   Thyroid disease Sister    Stomach cancer Mother    Colon cancer Neg Hx    Colon polyps Neg Hx    Esophageal cancer Neg Hx    Rectal cancer Neg Hx     BP (!) 110/44 (BP Location: Right Arm, Patient Position: Sitting, Cuff Size: Large)   Pulse 80   Ht 5' 8.5" (1.74 m)   Wt 193 lb 12.8 oz (87.9 kg)   SpO2 98%   BMI 29.04 kg/m    Review of Systems     Objective:   Physical Exam VITAL SIGNS:  See vs page GENERAL: no distress NECK: thyroid is slightly enlarged, with multinodular surface.  1-2 cm LLP nodule is palpable.    Lab Results  Component Value Date   TSH 3.13 03/20/2021      Assessment & Plan:  MNG: we discussed.  Pt declines f/u US until next year Hypothyroidism: well-controlled.  Please continue the same synthroid.

## 2021-03-21 ENCOUNTER — Telehealth: Payer: Self-pay | Admitting: Endocrinology

## 2021-03-21 ENCOUNTER — Telehealth: Payer: Self-pay

## 2021-03-21 NOTE — Telephone Encounter (Signed)
Spoke with pt regarding labs

## 2021-03-21 NOTE — Telephone Encounter (Signed)
Pt has been notified of labs  

## 2021-03-21 NOTE — Telephone Encounter (Signed)
Patient returned Derek Lowe's call regarding the message about labs.  Patient requested a call back to his cell phone - [phone]

## 2021-04-05 IMAGING — XA DG FLUORO GUIDE NDL PLC/BX
4 series · 4 of 4 positions shown · non-contrast
Comparison: none

CLINICAL DATA: Pain and limited range of motion.

[Series 1: ortho adipose · 1 of 1 slices shown (1 of 4)]
[im 1/1]
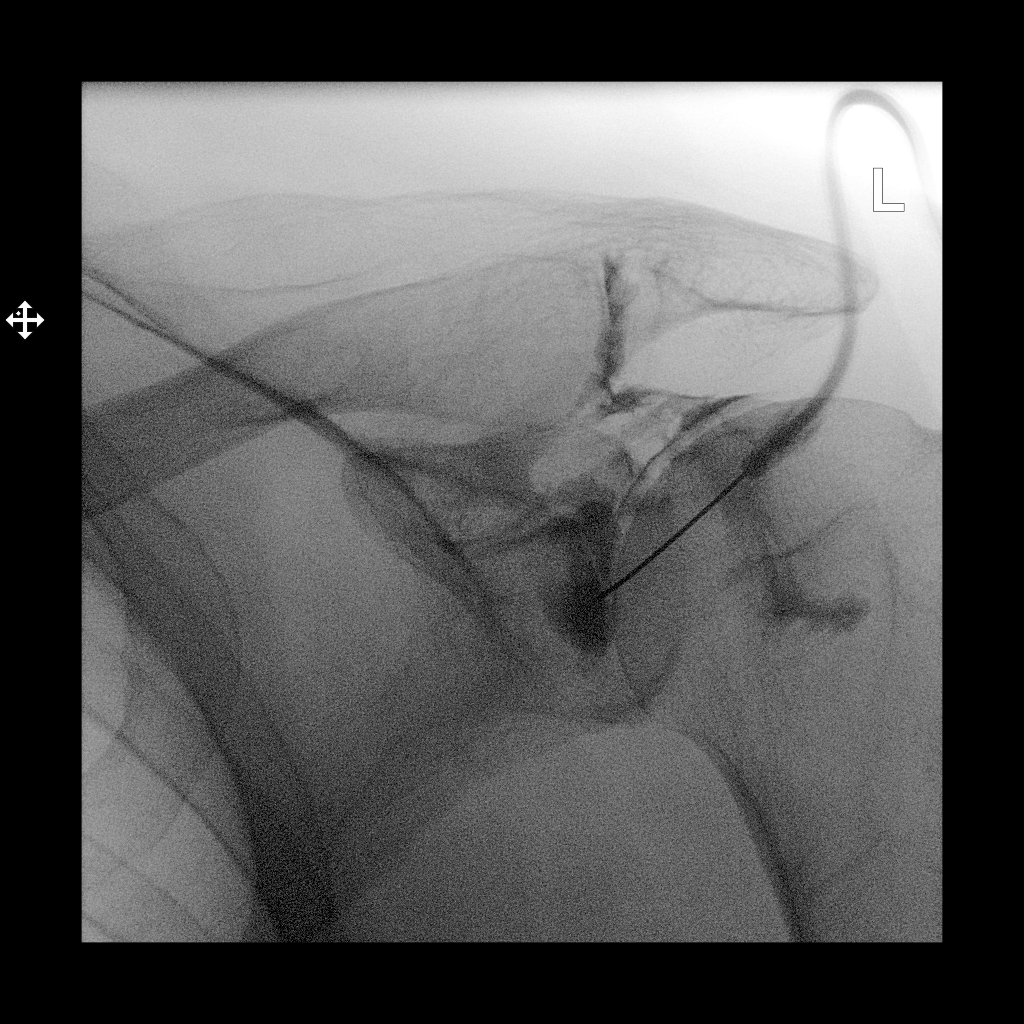

[Series 1: ortho adipose · 1 of 1 slices shown (2 of 4)]
[im 1/1]
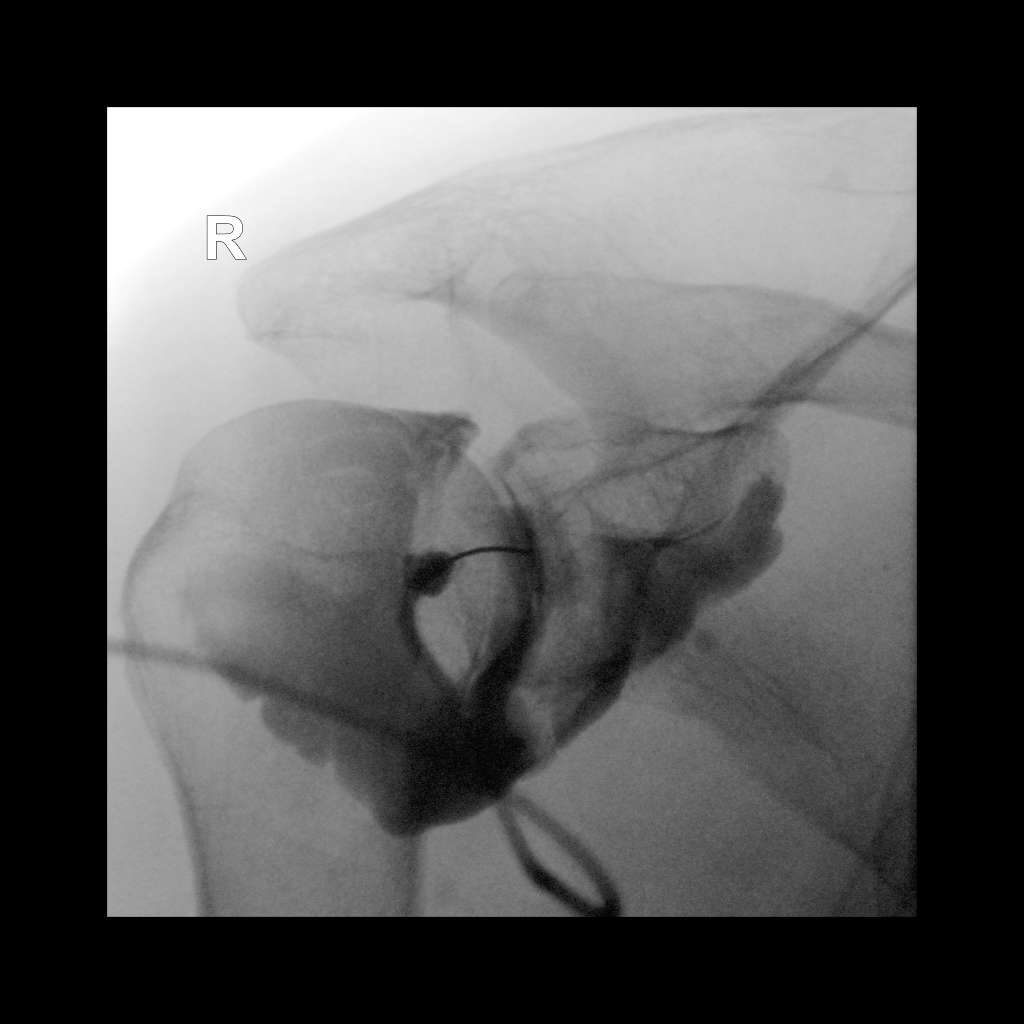

[Series 2: ortho adipose · 1 of 1 slices shown (3 of 4)]
[im 1/1]
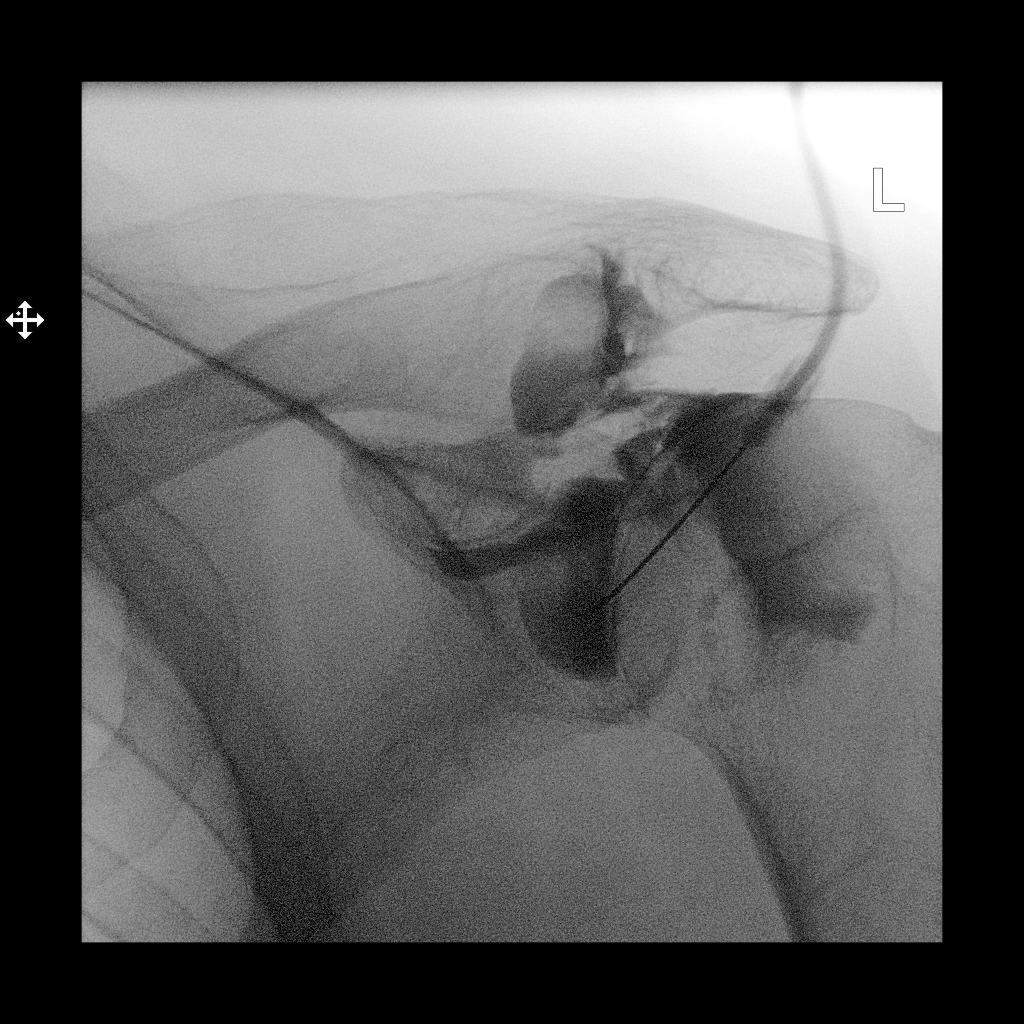

[Series 3: ortho adipose · 1 of 1 slices shown (4 of 4)]
[im 1/1]
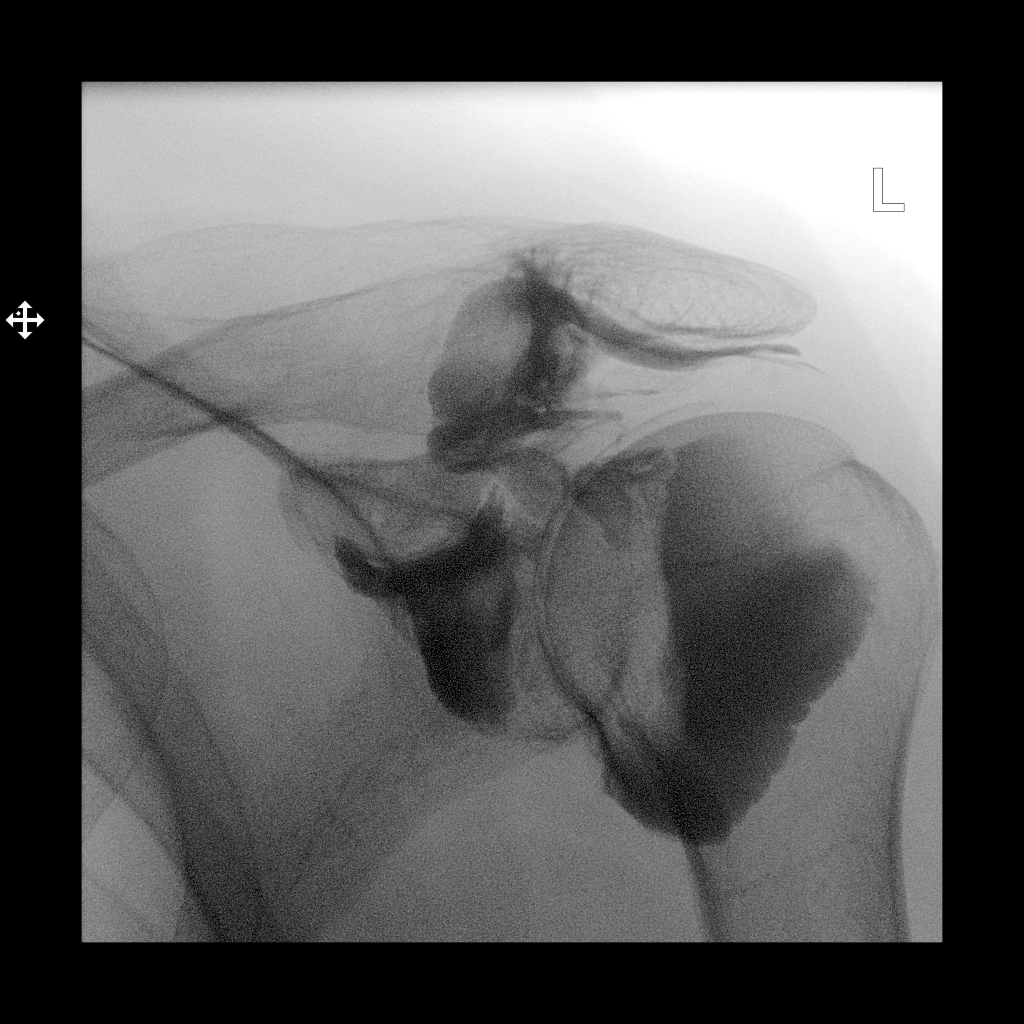

[4 of 4 positions shown; findings below may reference images not displayed]

FLUOROSCOPY TIME:  0 minutes 17 seconds for the left shoulder.
micro gray meter squared. 0 minutes 11 seconds with the right
shoulder. 6.29 micro gray meter squared.

PROCEDURE:
Bilateral SHOULDER INJECTION UNDER FLUOROSCOPY

The skin overlying each shoulder was scrubbed with Betadine and
draped in sterile fashion. Skin and subcutaneous anesthesia was
carried out using a 25 gauge needle and 1% lidocaine. A 22 gauge
spinal needle was directed under fluoroscopic guidance on one pass
into each glenohumeral joint. 20 cc of a mixture of 0.1 cc
MultiHance and dilute Isovue 200 was then used to fill each
glenohumeral joint. The left shoulder was done first, followed by
the MRI study of that shoulder. The patient was then returned for
injection of the left shoulder and was subsequently taken to MRI.
IMPRESSION: Technically successful bilateral shoulder injection for MRI.

Image on the right does not show any under surface cuff abnormality
or a gross abnormality of the shoulder joint volume.

Images on the left show a cuff tear with contrast communicating with
the AC joint and subacromial subdeltoid bursa.

## 2021-04-12 DIAGNOSIS — Z0000 Encounter for general adult medical examination without abnormal findings: Secondary | ICD-10-CM | POA: Diagnosis not present

## 2021-08-22 DIAGNOSIS — Z23 Encounter for immunization: Secondary | ICD-10-CM | POA: Diagnosis not present

## 2021-08-22 DIAGNOSIS — S61429A Laceration with foreign body of unspecified hand, initial encounter: Secondary | ICD-10-CM | POA: Diagnosis not present

## 2021-09-04 ENCOUNTER — Ambulatory Visit: Payer: Medicare Other | Admitting: Endocrinology

## 2021-09-10 ENCOUNTER — Other Ambulatory Visit: Payer: Self-pay | Admitting: Endocrinology

## 2021-09-10 DIAGNOSIS — E039 Hypothyroidism, unspecified: Secondary | ICD-10-CM

## 2021-10-29 DIAGNOSIS — Z20822 Contact with and (suspected) exposure to covid-19: Secondary | ICD-10-CM | POA: Diagnosis not present

## 2021-10-29 DIAGNOSIS — J020 Streptococcal pharyngitis: Secondary | ICD-10-CM | POA: Diagnosis not present

## 2021-12-19 DIAGNOSIS — H2513 Age-related nuclear cataract, bilateral: Secondary | ICD-10-CM | POA: Diagnosis not present

## 2021-12-19 DIAGNOSIS — H40033 Anatomical narrow angle, bilateral: Secondary | ICD-10-CM | POA: Diagnosis not present

## 2022-01-08 DIAGNOSIS — Z1211 Encounter for screening for malignant neoplasm of colon: Secondary | ICD-10-CM | POA: Diagnosis not present

## 2022-01-27 DIAGNOSIS — Z1211 Encounter for screening for malignant neoplasm of colon: Secondary | ICD-10-CM | POA: Diagnosis not present

## 2022-01-27 DIAGNOSIS — D123 Benign neoplasm of transverse colon: Secondary | ICD-10-CM | POA: Diagnosis not present

## 2022-01-27 LAB — HM COLONOSCOPY

## 2022-02-18 DIAGNOSIS — L309 Dermatitis, unspecified: Secondary | ICD-10-CM | POA: Diagnosis not present

## 2022-02-18 DIAGNOSIS — L299 Pruritus, unspecified: Secondary | ICD-10-CM | POA: Diagnosis not present

## 2022-03-12 ENCOUNTER — Encounter: Payer: Self-pay | Admitting: Internal Medicine

## 2022-03-12 ENCOUNTER — Ambulatory Visit (INDEPENDENT_AMBULATORY_CARE_PROVIDER_SITE_OTHER): Payer: Medicare Other | Admitting: Internal Medicine

## 2022-03-12 VITALS — BP 138/76 | HR 76 | Ht 68.5 in | Wt 197.0 lb

## 2022-03-12 DIAGNOSIS — E042 Nontoxic multinodular goiter: Secondary | ICD-10-CM | POA: Diagnosis not present

## 2022-03-12 DIAGNOSIS — E039 Hypothyroidism, unspecified: Secondary | ICD-10-CM

## 2022-03-12 NOTE — Progress Notes (Signed)
Name: Derek Lowe  MRN/ DOB: [id number], 11/06/1950    Age/ Sex: 71 y.o., male     PCP: Suzan Garibaldi, FNP   Reason for Endocrinology Evaluation: Hypothyroidism/MNG     Initial Endocrinology Clinic Visit: 05/06/2017    PATIENT IDENTIFIER: Derek Lowe is a 71 y.o., male with a past medical history of multinodular goiter and hypothyroid. He has followed with [facility] since 05/06/2017 for consultative assistance with management of his MNG.   HISTORICAL SUMMARY: The patient was first diagnosed with hypothyroidism in 2018.    He was diagnosed with multinodular goiter based on thyroid ultrasound from February 2020.   He is s/p FNA of the left thyroid nodule with scant cellularity (Bethesda category I) 08/03/2018  Per previous endocrinologist note patient declined repeat FNA, the pt denied refusing repeat FNA on his visit with me 03/2022  He was seen by Dr. Loanne Drilling between 2018 until October 2022   Sister with thyroid disease  SUBJECTIVE:    Today (03/13/2022):  Derek Lowe is here for for follow-up on hypothyroidism and multinodular goiter.  Weight continues to fluctuate  Denies local neck swelling  Denies constipation  Denies palpitations  Has noted slight tremors  Has right knee pain    Levothyroxine 50 mcg , half a tablet daily       HISTORY:  Past Medical History:  Past Medical History:  Diagnosis Date   Arthritis    Hypothyroidism    Past Surgical History:  Past Surgical History:  Procedure Laterality Date   COLONOSCOPY  2011   Social History:  reports that he has never smoked. He has quit using smokeless tobacco.  His smokeless tobacco use included chew. He reports current alcohol use. He reports that he does not use drugs. Family History:  Family History  Problem Relation Age of Onset   Thyroid disease Sister    Stomach cancer Mother    Colon cancer Neg Hx    Colon polyps Neg Hx    Esophageal cancer Neg Hx    Rectal cancer Neg Hx       HOME MEDICATIONS: Allergies as of 03/12/2022   No Known Allergies      Medication List        Accurate as of March 12, 2022 11:59 PM. If you have any questions, ask your nurse or doctor.          ferrous sulfate 324 MG Tbec Take 324 mg by mouth.   Fish Oil 1000 MG Caps Take by mouth.   levothyroxine 50 MCG tablet Commonly known as: SYNTHROID TAKE 1/2 TABLET BY MOUTH DAILY BEFORE BREAKFAST   meloxicam 15 MG tablet Commonly known as: MOBIC TAKE 1 TABLET (15 MG TOTAL) BY MOUTH EVERY OTHER DAY.   Sutab [id number] MG Tabs Generic drug: Sodium Sulfate-Mag Sulfate-KCl Take 12 tablets by mouth as directed.   triamcinolone cream 0.1 % Commonly known as: KENALOG APPLY TO AFFECTED AREA(S) 3 TIMES DAILY AS NEEDED          OBJECTIVE:   PHYSICAL EXAM: VS: BP 138/76 (BP Location: Right Arm, Patient Position: Sitting, Cuff Size: Small)   Pulse 76   Ht 5' 8.5" (1.74 m)   Wt 197 lb (89.4 kg)   SpO2 97%   BMI 29.52 kg/m    EXAM: General: Pt appears well and is in NAD  Eyes: External eye exam normal without stare, lid lag or exophthalmos.  EOM intact.    Neck: General: Supple without adenopathy. Thyroid: Thyroid  size normal.  No goiter or nodules appreciated. No thyroid bruit.  Lungs: Clear with good BS bilat with no rales, rhonchi, or wheezes  Heart: Auscultation: RRR.  Abdomen: Normoactive bowel sounds, soft, nontender, without masses or organomegaly palpable  Extremities:  BL LE: No pretibial edema normal ROM and strength.  Mental Status: Judgment, insight: Intact Orientation: Oriented to time, place, and person Mood and affect: No depression, anxiety, or agitation     DATA REVIEWED:   Latest Reference Range & Units 03/20/21 14:20  TSH 0.35 - 5.50 uIU/mL 3.13  T4,Free(Direct) 0.60 - 1.60 ng/dL 0.74   Thyroid ultrasound 02/27/2020  Estimated total number of nodules >/= 1 cm: 3   Number of spongiform nodules >/=  2 cm not described below  (TR1): 0   Number of mixed cystic and solid nodules >/= 1.5 cm not described below ([facility]): 0   _________________________________________________________   Nodule # 1: Small isoechoic solid nodule in the right inferior gland measures up to 1.2 cm, insignificantly changed compared to 1.3 cm previously.   _________________________________________________________   Nodule # 2:   Prior biopsy: No   Location: Left; Superior   Maximum size: 1.4 cm; Other 2 dimensions: 1.2 x 1.1 cm, previously, 1.4 x 1.3 x 1.2 cm   Composition: solid/almost completely solid (2)   Echogenicity: isoechoic (1)   Shape: not taller-than-wide (0)   Margins: smooth (0)   Echogenic foci: punctate echogenic foci (3)   ACR TI-RADS total points: 6.   ACR TI-RADS risk category:  TR4 (4-6 points).   Significant change in size (>/= 20% in two dimensions and minimal increase of 2 mm): No   Change in features: Yes   Change in ACR TI-RADS risk category: No   ACR TI-RADS recommendations:   *Given size (>/= 1 - 1.4 cm) and appearance, a follow-up ultrasound in 1 year should be considered based on TI-RADS criteria.   _________________________________________________________   Nodule # 3: Stable previously biopsied nodule in the left mid to lower gland at 2.4 x 1.3 x 1.1 cm compared to 2.5 x 1.8 x 1.1 cm previously.   _________________________________________________________   IMPRESSION: 1. No significant interval change in the size or appearance of the previously biopsied nodule in the left mid to lower gland. 2. Changing morphology of a 1.4 cm solid nodule in the left superior gland. On today's images, punctate echogenic foci are present which up grades the TI-RADS category to 4. This lesion now meets criteria to warrant annual surveillance. Recommend follow-up ultrasound in 1 year.  ASSESSMENT / PLAN / RECOMMENDATIONS:   Hypothyroidism  - Pt is clinically and biochemically euthyroid  - We  discussed switching levothyroxine 50 mcg , half a tablet to 25 mcg daily to make it easy on his wife who has been cutting them  - He denies any intolerance issues with levothyroxine 25 mcg    Medications  Stop Levothyroxine 50 mg  Start Levothyroxine 25 mcg daily     2. MNG:  - No local neck symptoms  - He is s/p FNA of the left thyroid nodule with scant cellularity (Bethesda category I) 08/03/2018 - Will repeat thyroid ultrasound and have a low threshold for repeat FNA    F/U in 1 yr   Signed electronically by: Mack Guise, MD  [facility]  [facility] [address] [place], [place] [address] Phone: [phone] FAX: [phone]      CC: Suzan Garibaldi, [place]. [address]  [place] [address] Phone: [phone]  Fax: [phone]   Return to Endocrinology clinic as below: Future Appointments  Date Time Provider [place]  03/13/2023  2:00 PM Narcisa Ganesh, Melanie Crazier, MD [id number] None

## 2022-03-12 NOTE — Patient Instructions (Signed)

## 2022-03-13 ENCOUNTER — Telehealth: Payer: Self-pay | Admitting: Internal Medicine

## 2022-03-13 LAB — TSH: TSH: 3.1 u[IU]/mL (ref 0.35–5.50)

## 2022-03-13 MED ORDER — LEVOTHYROXINE SODIUM 25 MCG PO TABS: 25.0000 ug | ORAL_TABLET | Freq: Every day | ORAL | 3 refills | Status: DC

## 2022-03-13 NOTE — Telephone Encounter (Signed)
Spoke with patient spouse Loletha Carrow and she advised me to leave a detailed vm for patient on his cell phone.

## 2022-03-13 NOTE — Telephone Encounter (Signed)
Please let the patient know that his current thyroid test is normal and I would suggest continuing with levothyroxine 25 mcg daily     I have told him yesterday that I was going to switch his levothyroxine from 50 mcg to 25 mcg, that way his wife does not have to cut the 50 tablets.  He will need to take levothyroxine 25 mcg 1 tablet daily   Thanks

## 2022-03-17 ENCOUNTER — Ambulatory Visit
Admission: RE | Admit: 2022-03-17 | Discharge: 2022-03-17 | Disposition: A | Discharge status: Home / Self Care | Payer: Medicare Other | Source: Ambulatory Visit | Attending: Internal Medicine | Admitting: Internal Medicine

## 2022-03-17 DIAGNOSIS — E042 Nontoxic multinodular goiter: Secondary | ICD-10-CM

## 2022-03-18 ENCOUNTER — Encounter: Payer: Self-pay | Admitting: Internal Medicine

## 2022-03-20 ENCOUNTER — Ambulatory Visit: Payer: Medicare Other | Admitting: Endocrinology

## 2022-03-31 ENCOUNTER — Encounter: Payer: Self-pay | Admitting: Family Medicine

## 2022-03-31 ENCOUNTER — Ambulatory Visit (INDEPENDENT_AMBULATORY_CARE_PROVIDER_SITE_OTHER): Payer: Medicare Other | Admitting: Family Medicine

## 2022-03-31 VITALS — BP 130/80 | Ht 68.0 in | Wt 195.0 lb

## 2022-03-31 DIAGNOSIS — M25561 Pain in right knee: Secondary | ICD-10-CM | POA: Diagnosis not present

## 2022-03-31 DIAGNOSIS — G8929 Other chronic pain: Secondary | ICD-10-CM

## 2022-03-31 MED ORDER — METHYLPREDNISOLONE ACETATE 40 MG/ML IJ SUSP
40.0000 mg | Freq: Once | INTRAMUSCULAR | Status: AC
  Administered 2022-03-31: 40 mg via INTRA_ARTICULAR

## 2022-03-31 NOTE — Progress Notes (Signed)
PCP: Suzan Garibaldi, FNP  Subjective:   HPI: Patient is a 71 y.o. male here for right knee pain.  Patient reports 6 months ago he was kicking some straw when he felt his right knee hyperextend. + swelling and discomfort anteriorly to posteriorly. No catching, locking, giving out. Started taking some nyquil for current cold and pain has improved, knee doesn't feel as swollen. Tried wrapping knee, icing. Recalls remotely feeling that it remotely went out of joint and he put it back in.  Past Medical History:  Diagnosis Date   Arthritis    Hypothyroidism     Current Outpatient Medications on File Prior to Visit  Medication Sig Dispense Refill   levothyroxine (SYNTHROID) 25 MCG tablet Take 1 tablet (25 mcg total) by mouth daily. 90 tablet 3   ferrous sulfate 324 MG TBEC Take 324 mg by mouth. (Patient not taking: Reported on 03/12/2022)     meloxicam (MOBIC) 15 MG tablet TAKE 1 TABLET (15 MG TOTAL) BY MOUTH EVERY OTHER DAY. (Patient not taking: Reported on 03/31/2022) 30 tablet 0   Omega-3 Fatty Acids (FISH OIL) 1000 MG CAPS Take by mouth.     Sodium Sulfate-Mag Sulfate-KCl (SUTAB) [phone] MG TABS Take 12 tablets by mouth as directed. (Patient not taking: Reported on 03/12/2022) 24 tablet 0   triamcinolone cream (KENALOG) 0.1 % APPLY TO AFFECTED AREA(S) 3 TIMES DAILY AS NEEDED (Patient not taking: Reported on 03/31/2022)     No current facility-administered medications on file prior to visit.    Past Surgical History:  Procedure Laterality Date   COLONOSCOPY  2011    No Known Allergies  BP 130/80   Ht '5\' 8"'$  (1.727 m)   Wt 195 lb (88.5 kg)   BMI 29.65 kg/m       No data to display              No data to display              Objective:  Physical Exam:  Gen: NAD, comfortable in exam room  Right knee: Mod effusion.  No other gross deformity, ecchymoses. No TTP. FROM with normal strength. Increased laxity ant drawer and lachman compared to left.   Negative post drawer. Negative valgus/varus testing. Negative mcmurrays, apleys. NV intact distally.   Assessment & Plan:  1. Right knee injury - with effusion, sustained 6 months ago.  Consistent with likely ACL tear.  No mechanical symptoms or instability.  Main concern is swelling and discomfort when it is swollen.  He tried compression, icing.  Advised against surgical intervention with lack of instability and mechanical symptoms; no point to getting MRI because of this.  He would like this drained and injected due to swelling so went ahead with this today.  See instructions for further.  After informed written consent timeout was performed, patient was lying supine on exam table.  Right knee was prepped with alcohol swab.  Utilizing superolateral approach, 3 mL of lidocaine was used for local anesthesia.  Then using an 18g needle on a 50cc syringe, 40 mL of clear straw-colored fluid was aspirated from right knee.  Knee was then injected with 3:1 lidocaine:depomedrol.  Patient tolerated procedure well without immediate complications.

## 2022-03-31 NOTE — Patient Instructions (Signed)
I'm worried your tore one the ligaments in your knee (ACL). You're doing really well though and since you don't have giving out, catching, locking, I'd recommend we treat this conservatively. Icing 15 minutes at a time 3-4 times a day. Compression sleeve or ACE wrap to help keep the swelling down. Elevate above your heart when possible. We drained and injected your knee today also. Consider MRI in future if this continues to be a problem.

## 2022-08-04 DIAGNOSIS — L309 Dermatitis, unspecified: Secondary | ICD-10-CM | POA: Diagnosis not present

## 2022-08-04 DIAGNOSIS — R7989 Other specified abnormal findings of blood chemistry: Secondary | ICD-10-CM | POA: Diagnosis not present

## 2022-08-04 DIAGNOSIS — E559 Vitamin D deficiency, unspecified: Secondary | ICD-10-CM | POA: Diagnosis not present

## 2022-08-04 DIAGNOSIS — Z87828 Personal history of other (healed) physical injury and trauma: Secondary | ICD-10-CM | POA: Diagnosis not present

## 2022-08-04 DIAGNOSIS — Z1331 Encounter for screening for depression: Secondary | ICD-10-CM | POA: Diagnosis not present

## 2022-08-04 DIAGNOSIS — Z83438 Family history of other disorder of lipoprotein metabolism and other lipidemia: Secondary | ICD-10-CM | POA: Diagnosis not present

## 2022-08-04 DIAGNOSIS — Z139 Encounter for screening, unspecified: Secondary | ICD-10-CM | POA: Diagnosis not present

## 2022-08-04 DIAGNOSIS — Z9181 History of falling: Secondary | ICD-10-CM | POA: Diagnosis not present

## 2022-08-04 DIAGNOSIS — E079 Disorder of thyroid, unspecified: Secondary | ICD-10-CM | POA: Diagnosis not present

## 2022-08-04 DIAGNOSIS — D649 Anemia, unspecified: Secondary | ICD-10-CM | POA: Diagnosis not present

## 2022-09-03 ENCOUNTER — Encounter: Payer: Self-pay | Admitting: Oncology

## 2022-09-03 ENCOUNTER — Inpatient Hospital Stay: Payer: Medicare Other

## 2022-09-03 ENCOUNTER — Telehealth: Payer: Self-pay | Admitting: Oncology

## 2022-09-03 ENCOUNTER — Other Ambulatory Visit: Payer: Self-pay

## 2022-09-03 ENCOUNTER — Inpatient Hospital Stay: Payer: Medicare Other | Attending: Oncology | Admitting: Oncology

## 2022-09-03 VITALS — BP 161/69 | HR 72 | Temp 97.5°F | Resp 14 | Ht 68.7 in | Wt 198.5 lb

## 2022-09-03 DIAGNOSIS — Z801 Family history of malignant neoplasm of trachea, bronchus and lung: Secondary | ICD-10-CM | POA: Diagnosis not present

## 2022-09-03 DIAGNOSIS — R7989 Other specified abnormal findings of blood chemistry: Secondary | ICD-10-CM

## 2022-09-03 LAB — IRON AND TIBC
Iron: 74 ug/dL (ref 45–182)
Saturation Ratios: 23 % (ref 17.9–39.5)
TIBC: 318 ug/dL (ref 250–450)
UIBC: 244 ug/dL

## 2022-09-03 LAB — C-REACTIVE PROTEIN: CRP: 1.1 mg/dL — ABNORMAL HIGH (ref <1.0)

## 2022-09-03 LAB — FERRITIN: Ferritin: 342 ng/mL — ABNORMAL HIGH (ref 24–336)

## 2022-09-03 LAB — SEDIMENTATION RATE: Sed Rate: 5 mm/hr (ref 0–16)

## 2022-09-03 NOTE — Telephone Encounter (Signed)
09/03/22 Next appt scheduled and confirmed with patient

## 2022-09-03 NOTE — Progress Notes (Signed)
[facility] Initial Visit:  Patient Care Team: Herbert Deaner, FNP as PCP - General (Nurse Practitioner)  CHIEF COMPLAINTS/PURPOSE OF CONSULTATION:   HISTORY OF PRESENTING ILLNESS: Derek Lowe 72 y.o. male is here because of elevated ferritin Medical history notable for hypothyroidism, multinodular goiter  August 26, 2022: WBC 4.6 hemoglobin 14.2 MCV 90 platelet count 250; 57 segs 26 lymphs 11 monos 5 eos Ferritin 567 B12 380 folate 7.8  September 03 2022:  [facility] Consult  He states that in the past he took oral iron about a year.  About 3 weeks ago he restarted taking oral iron under direction of his FNP (per patient)  Social:  Married.  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  Does not smoke.  Formerly chewed tobacco; quit 6 years ago.  Occasionally will drink 3 to 4 beers on weekends  [facility] Mother died 84 old age Father died 69 lung cancer Sister alive 5 arthritis, history of polio Sister alive 18 thyroid problems, rashes, palpitations.     Review of Systems  Constitutional:  Negative for appetite change, chills, fatigue, fever and unexpected weight change.  HENT:   Negative for mouth sores, nosebleeds, sore throat, trouble swallowing and voice change.   Eyes:  Negative for eye problems and icterus.       Vision changes:  None  Respiratory:  Negative for chest tightness, cough, hemoptysis, shortness of breath and wheezing.        PND:  none Orthopnea:  none DOE:    Cardiovascular:  Negative for chest pain, leg swelling and palpitations.       PND:  none Orthopnea:  none  Gastrointestinal:  Negative for abdominal distention, abdominal pain, blood in stool, constipation, diarrhea, nausea, rectal pain and vomiting.  Endocrine: Negative for hot flashes.       Cold intolerance:  none Heat intolerance:  none  Genitourinary:  Negative for bladder incontinence, difficulty urinating, dysuria, frequency, hematuria and nocturia.    Musculoskeletal:  Negative for back pain, gait problem, myalgias, neck pain and neck stiffness.       Some arthralgias right knee  Skin:  Negative for rash and wound.       Has itching which is helped by showering and a steroid cream.  No rash  Neurological:  Negative for dizziness, extremity weakness, gait problem, headaches, light-headedness, numbness, seizures and speech difficulty.  Hematological:  Negative for adenopathy. Does not bruise/bleed easily.  Psychiatric/Behavioral:  Negative for suicidal ideas. The patient is not nervous/anxious.     MEDICAL HISTORY: Past Medical History:  Diagnosis Date   Arthritis    Hypothyroidism     SURGICAL HISTORY: Past Surgical History:  Procedure Laterality Date   COLONOSCOPY  2011    SOCIAL HISTORY: Social History   Socioeconomic History   Marital status: Married    Spouse name: Not on file   Number of children: Not on file   Years of education: Not on file   Highest education level: Not on file  Occupational History   Not on file  Tobacco Use   Smoking status: Never   Smokeless tobacco: Former    Types: Nurse, children's Use: Never used  Substance and Sexual Activity   Alcohol use: Yes   Drug use: No   Sexual activity: Yes  Other Topics Concern   Not on file  Social History Narrative   Not on file   Social Determinants of Health  Financial Resource Strain: Not on file  Food Insecurity: No Food Insecurity (09/03/2022)   Hunger Vital Sign    Worried About Running Out of Food in the Last Year: Never true    Ran Out of Food in the Last Year: Never true  Transportation Needs: No Transportation Needs (09/03/2022)   PRAPARE - Hydrologist (Medical): No    Lack of Transportation (Non-Medical): No  Physical Activity: Not on file  Stress: Not on file  Social Connections: Not on file  Intimate Partner Violence: Not At Risk (09/03/2022)   Humiliation, Afraid, Rape, and Kick questionnaire     Fear of Current or Ex-Partner: No    Emotionally Abused: No    Physically Abused: No    Sexually Abused: No    FAMILY HISTORY Family History  Problem Relation Age of Onset   Stomach cancer Father    Thyroid disease Sister    Colon cancer Neg Hx    Colon polyps Neg Hx    Esophageal cancer Neg Hx    Rectal cancer Neg Hx     ALLERGIES:  has No Known Allergies.  MEDICATIONS:  Current Outpatient Medications  Medication Sig Dispense Refill   ferrous sulfate 324 MG TBEC Take 324 mg by mouth.     levothyroxine (SYNTHROID) 25 MCG tablet Take 1 tablet (25 mcg total) by mouth daily. 90 tablet 3   loratadine (CLARITIN) 10 MG tablet Take 10 mg by mouth daily.     No current facility-administered medications for this visit.    PHYSICAL EXAMINATION:  ECOG PERFORMANCE STATUS: 0 - Asymptomatic   Vitals:   09/03/22 1307  BP: (!) 161/69  Pulse: 72  Resp: 14  Temp: (!) 97.5 F (36.4 C)  SpO2: 97%    Filed Weights   09/03/22 1307  Weight: 198 lb 8 oz (90 kg)     Physical Exam Vitals and nursing note reviewed.  Constitutional:      General: He is not in acute distress.    Appearance: Normal appearance. He is normal weight. He is not ill-appearing or diaphoretic.     Comments: Here alone.    HENT:     Head: Normocephalic and atraumatic.     Right Ear: External ear normal.     Left Ear: External ear normal.     Nose: Nose normal.  Eyes:     General: No scleral icterus.    Conjunctiva/sclera: Conjunctivae normal.     Pupils: Pupils are equal, round, and reactive to light.  Cardiovascular:     Rate and Rhythm: Normal rate and regular rhythm.     Heart sounds: No murmur heard.    No friction rub. No gallop.  Pulmonary:     Effort: Pulmonary effort is normal. No respiratory distress.     Breath sounds: Normal breath sounds. No wheezing.  Abdominal:     General: Bowel sounds are normal.     Palpations: Abdomen is soft.     Tenderness: There is no abdominal tenderness.   Musculoskeletal:        General: Normal range of motion.     Cervical back: Normal range of motion and neck supple. No rigidity or tenderness.     Right lower leg: No edema.     Left lower leg: No edema.  Lymphadenopathy:     Head:     Right side of head: No submental, submandibular, tonsillar, preauricular, posterior auricular or occipital adenopathy.     Left  side of head: No submental, submandibular, tonsillar, preauricular, posterior auricular or occipital adenopathy.     Cervical: No cervical adenopathy.     Right cervical: No superficial, deep or posterior cervical adenopathy.    Left cervical: No superficial, deep or posterior cervical adenopathy.     Upper Body:     Right upper body: No supraclavicular or axillary adenopathy.     Left upper body: No supraclavicular or axillary adenopathy.     Lower Body: No right inguinal adenopathy. No left inguinal adenopathy.  Skin:    General: Skin is warm.     Coloration: Skin is not pale.  Neurological:     General: No focal deficit present.     Mental Status: He is alert and oriented to person, place, and time.     Cranial Nerves: No cranial nerve deficit.  Psychiatric:        Mood and Affect: Mood normal.        Behavior: Behavior normal.        Thought Content: Thought content normal.        Judgment: Judgment normal.      LABORATORY DATA: I have personally reviewed the data as listed:  No visits with results within 1 Month(s) from this visit.  Latest known visit with results is:  Office Visit on 03/12/2022  Component Date Value Ref Range Status   TSH 03/12/2022 3.10  0.35 - 5.50 uIU/mL Final    RADIOGRAPHIC STUDIES: I have personally reviewed the radiological images as listed and agree with the findings in the report  No results found.  ASSESSMENT/PLAN  Possible causes of raised serum ferritin in this patient Increased ferritin synthesis due to iron accumulation  Hereditary (genetic) haemochromatosis Secondary  iron overload from excessive iron intake/administration Ferroportin disease    Increase in ferritin synthesis not associated with significant iron accumulation  Malignancies Malignant or reactive histiocytosis Hereditary hyperferritinaemia with and without cataracts Gaucher disease Chronic inflammatory disorders Autoimmune disorders    Increased ferritin as a result of cellular damage Liver diseases (chronic viral hepatitis, non-alcoholic steatohepatitis)   For the majority of persons with a raised serum ferritin, chronic inflammatory or infective causes as well as liver disease, alcohol and malignancies will be the more likely conditions seen  Will check Ferritin, iron studies, ESR, CRP, HFE gene study Instructed patient to discontinue use of oral iron    Cancer Staging  No matching staging information was found for the patient.   No problem-specific Assessment & Plan notes found for this encounter.    No orders of the defined types were placed in this encounter.   45  minutes was spent in patient care.  This included time spent preparing to see the patient (e.g., review of tests), obtaining and/or reviewing separately obtained history, counseling and educating the patient/family/caregiver, ordering medications, tests, or procedures; documenting clinical information in the electronic or other health record, independently interpreting results and communicating results to the patient/family/caregiver as well as coordination of care.       All questions were answered. The patient knows to call the clinic with any problems, questions or concerns.  This note was electronically signed.    Barbee Cough, MD  09/03/2022 1:20 PM

## 2022-09-04 ENCOUNTER — Telehealth: Payer: Self-pay | Admitting: Oncology

## 2022-09-04 NOTE — Telephone Encounter (Signed)
Contacted pt to schedule an appt. Unable to reach via phone, voicemail was left.   Message Received: Today Derek Lowe, CMA sent to Derek Lowe Is it possible to have the patient to come back to have one more lab drawn. We didn't get enough blood for this one test.

## 2022-09-05 ENCOUNTER — Inpatient Hospital Stay: Payer: Medicare Other

## 2022-09-05 DIAGNOSIS — Z801 Family history of malignant neoplasm of trachea, bronchus and lung: Secondary | ICD-10-CM | POA: Diagnosis not present

## 2022-09-05 DIAGNOSIS — R7989 Other specified abnormal findings of blood chemistry: Secondary | ICD-10-CM

## 2022-09-17 ENCOUNTER — Inpatient Hospital Stay (INDEPENDENT_AMBULATORY_CARE_PROVIDER_SITE_OTHER): Payer: Medicare Other | Admitting: Oncology

## 2022-09-17 VITALS — BP 153/74 | HR 84 | Temp 98.4°F | Resp 16 | Ht 68.7 in | Wt 197.6 lb

## 2022-09-17 DIAGNOSIS — R7989 Other specified abnormal findings of blood chemistry: Secondary | ICD-10-CM

## 2022-09-17 DIAGNOSIS — M25561 Pain in right knee: Secondary | ICD-10-CM | POA: Diagnosis not present

## 2022-09-17 DIAGNOSIS — M6281 Muscle weakness (generalized): Secondary | ICD-10-CM | POA: Diagnosis not present

## 2022-09-17 NOTE — Progress Notes (Signed)
[facility] Cancer Follow up Visit:  Patient Care Team: Ellis Parents, FNP as PCP - General (Nurse Practitioner)  CHIEF COMPLAINTS/PURPOSE OF CONSULTATION:   HISTORY OF PRESENTING ILLNESS: Derek Lowe 72 y.o. male is here because of elevated ferritin Medical history notable for hypothyroidism, multinodular goiter  August 26, 2022: WBC 4.6 hemoglobin 14.2 MCV 90 platelet count 250; 57 segs 26 lymphs 11 monos 5 eos Ferritin 567 B12 380 folate 7.8  September 03 2022:  [facility] Hematology Consult  He states that in the past he took oral iron about a year.  About 3 weeks ago he restarted taking oral iron under direction of his FNP (per patient)  Social:  Married.  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  Does not smoke.  Formerly chewed tobacco; quit 6 years ago.  Occasionally will drink 3 to 4 beers on weekends  [facility] Mother died 36 old age Father died 43 lung cancer Sister alive 37 arthritis, history of polio Sister alive 46 thyroid problems, rashes, palpitations.    Ferritin 342 iron saturation 23% CRP 1.1 sed rate 5  September 17, 2022: Scheduled follow-up for elevated ferritin.  Reviewed results of labs with patient.  HFE gene mutation analysis pending  Review of Systems  Constitutional:  Negative for appetite change, chills, fatigue, fever and unexpected weight change.  HENT:   Negative for mouth sores, nosebleeds, sore throat, trouble swallowing and voice change.   Eyes:  Negative for eye problems and icterus.       Vision changes:  None  Respiratory:  Negative for chest tightness, cough, hemoptysis, shortness of breath and wheezing.        PND:  none Orthopnea:  none DOE:    Cardiovascular:  Negative for chest pain, leg swelling and palpitations.       PND:  none Orthopnea:  none  Gastrointestinal:  Negative for abdominal distention, abdominal pain, blood in stool, constipation, diarrhea, nausea, rectal pain and vomiting.  Endocrine: Negative for hot  flashes.       Cold intolerance:  none Heat intolerance:  none  Genitourinary:  Negative for bladder incontinence, difficulty urinating, dysuria, frequency, hematuria and nocturia.   Musculoskeletal:  Negative for back pain, gait problem, myalgias, neck pain and neck stiffness.       Some arthralgias right knee  Skin:  Negative for rash and wound.       Has itching which is helped by showering and a steroid cream.  No rash  Neurological:  Negative for dizziness, extremity weakness, gait problem, headaches, light-headedness, numbness, seizures and speech difficulty.  Hematological:  Negative for adenopathy. Does not bruise/bleed easily.  Psychiatric/Behavioral:  Negative for suicidal ideas. The patient is not nervous/anxious.     MEDICAL HISTORY: Past Medical History:  Diagnosis Date   Arthritis    Hypothyroidism     SURGICAL HISTORY: Past Surgical History:  Procedure Laterality Date   COLONOSCOPY  2011    SOCIAL HISTORY: Social History   Socioeconomic History   Marital status: Married    Spouse name: Not on file   Number of children: Not on file   Years of education: Not on file   Highest education level: Not on file  Occupational History   Not on file  Tobacco Use   Smoking status: Never   Smokeless tobacco: Former    Types: Associate Professor Use: Never used  Substance and Sexual Activity   Alcohol use: Yes  Drug use: No   Sexual activity: Yes  Other Topics Concern   Not on file  Social History Narrative   Not on file   Social Determinants of Health   Financial Resource Strain: Not on file  Food Insecurity: No Food Insecurity (09/03/2022)   Hunger Vital Sign    Worried About Running Out of Food in the Last Year: Never true    Ran Out of Food in the Last Year: Never true  Transportation Needs: No Transportation Needs (09/03/2022)   PRAPARE - Administrator, Civil Service (Medical): No    Lack of Transportation (Non-Medical): No  Physical  Activity: Not on file  Stress: Not on file  Social Connections: Not on file  Intimate Partner Violence: Not At Risk (09/03/2022)   Humiliation, Afraid, Rape, and Kick questionnaire    Fear of Current or Ex-Partner: No    Emotionally Abused: No    Physically Abused: No    Sexually Abused: No    FAMILY HISTORY Family History  Problem Relation Age of Onset   Stomach cancer Father    Thyroid disease Sister    Colon cancer Neg Hx    Colon polyps Neg Hx    Esophageal cancer Neg Hx    Rectal cancer Neg Hx     ALLERGIES:  has No Known Allergies.  MEDICATIONS:  Current Outpatient Medications  Medication Sig Dispense Refill   levothyroxine (SYNTHROID) 25 MCG tablet Take 1 tablet (25 mcg total) by mouth daily. 90 tablet 3   loratadine (CLARITIN) 10 MG tablet Take 10 mg by mouth daily.     No current facility-administered medications for this visit.    PHYSICAL EXAMINATION:  ECOG PERFORMANCE STATUS: 0 - Asymptomatic   Vitals:   09/17/22 1610  BP: (!) 153/74  Pulse: 84  Resp: 16  Temp: 98.4 F (36.9 C)  SpO2: 98%    Filed Weights   09/17/22 1610  Weight: 197 lb 9.6 oz (89.6 kg)     Physical Exam Vitals and nursing note reviewed.  Constitutional:      General: He is not in acute distress.    Appearance: Normal appearance. He is normal weight. He is not ill-appearing or diaphoretic.     Comments: Here alone.    HENT:     Head: Normocephalic and atraumatic.     Right Ear: External ear normal.     Left Ear: External ear normal.     Nose: Nose normal.  Eyes:     General: No scleral icterus.    Conjunctiva/sclera: Conjunctivae normal.     Pupils: Pupils are equal, round, and reactive to light.  Cardiovascular:     Rate and Rhythm: Normal rate and regular rhythm.     Heart sounds: No murmur heard.    No friction rub. No gallop.  Pulmonary:     Effort: Pulmonary effort is normal. No respiratory distress.     Breath sounds: Normal breath sounds. No wheezing.   Abdominal:     General: Bowel sounds are normal.     Palpations: Abdomen is soft.     Tenderness: There is no abdominal tenderness.  Musculoskeletal:        General: Normal range of motion.     Cervical back: Normal range of motion and neck supple. No rigidity or tenderness.     Right lower leg: No edema.     Left lower leg: No edema.  Lymphadenopathy:     Head:  Right side of head: No submental, submandibular, tonsillar, preauricular, posterior auricular or occipital adenopathy.     Left side of head: No submental, submandibular, tonsillar, preauricular, posterior auricular or occipital adenopathy.     Cervical: No cervical adenopathy.     Right cervical: No superficial, deep or posterior cervical adenopathy.    Left cervical: No superficial, deep or posterior cervical adenopathy.     Upper Body:     Right upper body: No supraclavicular or axillary adenopathy.     Left upper body: No supraclavicular or axillary adenopathy.     Lower Body: No right inguinal adenopathy. No left inguinal adenopathy.  Skin:    General: Skin is warm.     Coloration: Skin is not pale.  Neurological:     General: No focal deficit present.     Mental Status: He is alert and oriented to person, place, and time.     Cranial Nerves: No cranial nerve deficit.  Psychiatric:        Mood and Affect: Mood normal.        Behavior: Behavior normal.        Thought Content: Thought content normal.        Judgment: Judgment normal.      LABORATORY DATA: I have personally reviewed the data as listed:  Clinical Support on 09/03/2022  Component Date Value Ref Range Status   CRP 09/03/2022 1.1 (H)  <1.0 mg/dL Final   Performed at [facility], [address] [place], [place] [address]   Sed Rate 09/03/2022 5  0 - 16 mm/hr Final   Performed at [facility], [address], [place], [place] [address]   Iron 09/03/2022 74  45 - 182 ug/dL Final   TIBC 09/81/1914 318  250 - 450 ug/dL  Final   Saturation Ratios 09/03/2022 23  17.9 - 39.5 % Final   UIBC 09/03/2022 244  ug/dL Final   Performed at [facility], [address], [place], [place] [address]  Office Visit on 09/03/2022  Component Date Value Ref Range Status   Ferritin 09/03/2022 342 (H)  24 - 336 ng/mL Final   Performed at [facility], [address] [place], [place] [address]    RADIOGRAPHIC STUDIES: I have personally reviewed the radiological images as listed and agree with the findings in the report  No results found.  ASSESSMENT/PLAN  Possible causes of raised serum ferritin in this patient Increased ferritin synthesis due to iron accumulation  Hereditary (genetic) haemochromatosis Secondary iron overload from excessive iron intake/administration Ferroportin disease    Increase in ferritin synthesis not associated with significant iron accumulation  Malignancies Malignant or reactive histiocytosis Hereditary hyperferritinaemia with and without cataracts Gaucher disease Chronic inflammatory disorders Autoimmune disorders    Increased ferritin as a result of cellular damage Liver diseases (chronic viral hepatitis, non-alcoholic steatohepatitis)   For the majority of persons with a raised serum ferritin, chronic inflammatory or infective causes as well as liver disease, alcohol and malignancies will be the more likely conditions seen  September 17 2022-  Follow up testing demonstrated a normal ferritin.  Awaiting results of HFE gene studies.  No need for therapeutic phlebotomy.  Recommended holding supplmental iron.   CRP and ESR were not suggestive of a significant inflammatory disorder   Cancer Staging  No matching staging information was found for the patient.   No problem-specific Assessment & Plan notes found for this encounter.    No orders  of the defined types were placed in this encounter.   30  minutes was spent in patient care.  This  included time spent preparing to see the patient (e.g., review of tests), obtaining and/or reviewing separately obtained history, counseling and educating the patient/family/caregiver, ordering medications, tests, or procedures; documenting clinical information in the electronic or other health record, independently interpreting results and communicating results to the patient/family/caregiver as well as coordination of care.       All questions were answered. The patient knows to call the clinic with any problems, questions or concerns.  This note was electronically signed.    Loni Muse, MD  09/17/2022 4:12 PM

## 2022-09-22 LAB — HEMOCHROMATOSIS DNA-PCR(C282Y,H63D)

## 2022-09-23 DIAGNOSIS — M25561 Pain in right knee: Secondary | ICD-10-CM | POA: Diagnosis not present

## 2022-09-23 DIAGNOSIS — M6281 Muscle weakness (generalized): Secondary | ICD-10-CM | POA: Diagnosis not present

## 2022-09-25 ENCOUNTER — Telehealth: Payer: Self-pay | Admitting: Oncology

## 2022-09-25 DIAGNOSIS — M25561 Pain in right knee: Secondary | ICD-10-CM | POA: Diagnosis not present

## 2022-09-25 DIAGNOSIS — M6281 Muscle weakness (generalized): Secondary | ICD-10-CM | POA: Diagnosis not present

## 2022-09-25 NOTE — Telephone Encounter (Signed)
Contacted pt to schedule an appt. Unable to reach via phone, voicemail was left.   Message Received: Isaiah Blakes, CMA sent to [facility] Scheduling Replies will be sent to [facility] Scheduling Please have patient schedule appointment.       Previous Messages    ----- Message ----- From: Loni Muse, MD Sent: 09/24/2022   1:52 PM EDT To: Arville Care, CMA  Patient will need follow up appointment to discuss results of hemochromatosis testing Thanks

## 2022-09-30 ENCOUNTER — Inpatient Hospital Stay (INDEPENDENT_AMBULATORY_CARE_PROVIDER_SITE_OTHER): Payer: Medicare Other | Admitting: Oncology

## 2022-09-30 VITALS — BP 139/78 | HR 69 | Temp 98.0°F | Resp 18 | Ht 68.7 in | Wt 195.3 lb

## 2022-09-30 DIAGNOSIS — R7989 Other specified abnormal findings of blood chemistry: Secondary | ICD-10-CM

## 2022-09-30 DIAGNOSIS — M6281 Muscle weakness (generalized): Secondary | ICD-10-CM | POA: Diagnosis not present

## 2022-09-30 DIAGNOSIS — M25561 Pain in right knee: Secondary | ICD-10-CM | POA: Diagnosis not present

## 2022-09-30 NOTE — Progress Notes (Signed)
[facility] Cancer Follow up Visit:  Patient Care Team: Ellis Parents, FNP as PCP - General (Nurse Practitioner)  CHIEF COMPLAINTS/PURPOSE OF CONSULTATION:   HISTORY OF PRESENTING ILLNESS: Derek Lowe 72 y.o. male is here because of elevated ferritin Medical history notable for hypothyroidism, multinodular goiter  January 27 2022:  Colonoscopy- 4 mm polyp transverse colon.  Benign.    August 26, 2022: WBC 4.6 hemoglobin 14.2 MCV 90 platelet count 250; 57 segs 26 lymphs 11 monos 5 eos Ferritin 567 B12 380 folate 7.8  September 03 2022:  [facility] Hematology Consult  He states that in the past he took oral iron about a year.  About 3 weeks ago he restarted taking oral iron under direction of his FNP (per patient)  Social:  Married.  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  Does not smoke.  Formerly chewed tobacco; quit 6 years ago.  Occasionally will drink 3 to 4 beers on weekends  [facility] Mother died 90 old age Father died 58 lung cancer Sister alive 74 arthritis, history of polio Sister alive 47 thyroid problems, rashes, palpitations.    Ferritin 342 iron saturation 23% CRP 1.1 sed rate 5  September 17, 2022: Reviewed results of labs with patient.  HFE gene mutation analysis pending  September 30 2022:  Scheduled follow-up for elevated ferritin. Reviewed results of labs with the patient.   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  HENT:   Negative for mouth sores, nosebleeds, sore throat, trouble swallowing and voice change.   Eyes:  Negative for eye problems and icterus.       Vision changes:  None  Respiratory:  Negative for chest tightness, cough, hemoptysis, shortness of breath and wheezing.        PND:  none Orthopnea:  none DOE:    Cardiovascular:  Negative for chest pain, leg swelling  and palpitations.       PND:  none Orthopnea:  none  Gastrointestinal:  Negative for abdominal distention, abdominal pain, blood in stool, constipation, diarrhea, nausea, rectal pain and vomiting.  Endocrine: Negative for hot flashes.       Cold intolerance:  none Heat intolerance:  none  Genitourinary:  Negative for bladder incontinence, difficulty urinating, dysuria, frequency, hematuria and nocturia.   Musculoskeletal:  Negative for back pain, gait problem, myalgias, neck pain and neck stiffness.       Some arthralgias right knee  Skin:  Negative for rash and wound.       Has itching which is helped by showering and a steroid cream.  No rash  Neurological:  Negative for dizziness, extremity weakness, gait problem, headaches, light-headedness, numbness, seizures and speech difficulty.  Hematological:  Negative for adenopathy. Does not bruise/bleed easily.  Psychiatric/Behavioral:  Negative for suicidal ideas. The patient is not nervous/anxious.     MEDICAL HISTORY: Past Medical History:  Diagnosis Date   Arthritis    Hypothyroidism     SURGICAL HISTORY: Past Surgical History:  Procedure Laterality Date   COLONOSCOPY  2011    SOCIAL HISTORY: Social History   Socioeconomic History   Marital status: Married    Spouse name: Not on file   Number of children: Not on file   Years of education: Not on file   Highest education level: Not on file  Occupational History  Not on file  Tobacco Use   Smoking status: Never   Smokeless tobacco: Former    Types: Associate Professor Use: Never used  Substance and Sexual Activity   Alcohol use: Yes   Drug use: No   Sexual activity: Yes  Other Topics Concern   Not on file  Social History Narrative   Not on file   Social Determinants of Health   Financial Resource Strain: Not on file  Food Insecurity: No Food Insecurity (09/03/2022)   Hunger Vital Sign    Worried About Running Out of Food in the Last Year: Never true     Ran Out of Food in the Last Year: Never true  Transportation Needs: No Transportation Needs (09/03/2022)   PRAPARE - Administrator, Civil Service (Medical): No    Lack of Transportation (Non-Medical): No  Physical Activity: Not on file  Stress: Not on file  Social Connections: Not on file  Intimate Partner Violence: Not At Risk (09/03/2022)   Humiliation, Afraid, Rape, and Kick questionnaire    Fear of Current or Ex-Partner: No    Emotionally Abused: No    Physically Abused: No    Sexually Abused: No    FAMILY HISTORY Family History  Problem Relation Age of Onset   Stomach cancer Father    Thyroid disease Sister    Colon cancer Neg Hx    Colon polyps Neg Hx    Esophageal cancer Neg Hx    Rectal cancer Neg Hx     ALLERGIES:  has No Known Allergies.  MEDICATIONS:  Current Outpatient Medications  Medication Sig Dispense Refill   levothyroxine (SYNTHROID) 25 MCG tablet Take 1 tablet (25 mcg total) by mouth daily. 90 tablet 3   loratadine (CLARITIN) 10 MG tablet Take 10 mg by mouth daily.     No current facility-administered medications for this visit.    PHYSICAL EXAMINATION:  ECOG PERFORMANCE STATUS: 0 - Asymptomatic   Vitals:   09/30/22 1609  BP: 139/78  Pulse: 69  Resp: 18  Temp: 98 F (36.7 C)  SpO2: 98%    Filed Weights   09/30/22 1609  Weight: 195 lb 4.8 oz (88.6 kg)     Physical Exam Vitals and nursing note reviewed.  Constitutional:      General: He is not in acute distress.    Appearance: Normal appearance. He is normal weight. He is not ill-appearing or diaphoretic.     Comments: Here alone.    HENT:     Head: Normocephalic and atraumatic.     Right Ear: External ear normal.     Left Ear: External ear normal.     Nose: Nose normal.  Eyes:     General: No scleral icterus.    Conjunctiva/sclera: Conjunctivae normal.     Pupils: Pupils are equal, round, and reactive to light.  Cardiovascular:     Rate and Rhythm: Normal rate and  regular rhythm.     Heart sounds: No murmur heard.    No friction rub. No gallop.  Pulmonary:     Effort: Pulmonary effort is normal. No respiratory distress.     Breath sounds: Normal breath sounds. No wheezing.  Abdominal:     General: Bowel sounds are normal.     Palpations: Abdomen is soft.     Tenderness: There is no abdominal tenderness.  Musculoskeletal:        General: Normal range of motion.     Cervical  back: Normal range of motion and neck supple. No rigidity or tenderness.     Right lower leg: No edema.     Left lower leg: No edema.  Lymphadenopathy:     Head:     Right side of head: No submental, submandibular, tonsillar, preauricular, posterior auricular or occipital adenopathy.     Left side of head: No submental, submandibular, tonsillar, preauricular, posterior auricular or occipital adenopathy.     Cervical: No cervical adenopathy.     Right cervical: No superficial, deep or posterior cervical adenopathy.    Left cervical: No superficial, deep or posterior cervical adenopathy.     Upper Body:     Right upper body: No supraclavicular or axillary adenopathy.     Left upper body: No supraclavicular or axillary adenopathy.     Lower Body: No right inguinal adenopathy. No left inguinal adenopathy.  Skin:    General: Skin is warm.     Coloration: Skin is not pale.  Neurological:     General: No focal deficit present.     Mental Status: He is alert and oriented to person, place, and time.     Cranial Nerves: No cranial nerve deficit.  Psychiatric:        Mood and Affect: Mood normal.        Behavior: Behavior normal.        Thought Content: Thought content normal.        Judgment: Judgment normal.      LABORATORY DATA: I have personally reviewed the data as listed:  Scanned Document on 09/26/2022  Component Date Value Ref Range Status   HM Colonoscopy 01/27/2022 See Report (in chart)  See Report (in chart), Patient Reported Final   abstracted by HIM   Clinical Support on 09/05/2022  Component Date Value Ref Range Status   DNA Mutation Analysis 09/05/2022 Comment   Final   Comment: (NOTE) Results: c.845G>A (p.Cys282Tyr) - Not Detected c.187C>G (p.His63Asp) - Detected, heterozygous c.193A>T (p.Ser65Cys) - Not Detected Not associated with increased risk to develop clinical symptoms of Hereditary Hemochromatosis. In symptomatic individuals, other causes of iron overload should be evaluated. See Additional Information and Comments. Additional Clinical Information: Hereditary hemochromatosis (HFE related) is an autosomal recessive iron storage disorder. Patients may have a genetic diagnosis of hereditary hemochromatosis and never show clinical symptoms. Clinical symptoms typically appear between 40 to 60 years in males and after menopause in females. Signs and symptoms may include organ damage, primarily in the liver, risk for hepatocellular carcinoma, diabetes, and heart disease due to iron accumulation. Life expectancy may be decreased in individuals who develop cirrhosis. Treatment for clinically symptomatic individuals may include therapeutic ph                          lebotomy. Liver transplant may be used to treat end stage liver failure. For preventive care, monitoring for iron overload is recommended for patients who are homozygous for c.845G>A (p.Cys282Tyr) and have yet to experience clinical symptoms. Comments: The most common HFE variants associated with hereditary hemochromatosis are c.845G>A (p.Cys282Tyr), c.187C>G (p.His63Asp), c.193A>T (p.Ser65Cys). While patients homozygous for c.845G>A (p.Cys282Tyr) are the most likely to present clinical symptoms, less than 10% develop clinically significant iron overload with tissue and organ damage. Genetic counseling is recommended to discuss the potential clinical implications of positive results, as well as recommendations for testing family members. Genetic Coordinators  are available for health care providers to discuss results at [phone]). Test Details:  Three variants analyzed: c.845G>A (p.Cys282Tyr), commonly referred to as C282Y c.187C>G (p.His63Asp), commonly re                          ferred to as H63D c.193A>T (p.Ser65Cys), commonly referred to as S65C Methods/Limitations: DNA Analysis of the HFE gene (NM_[id number]) was performed by PCR amplification followed by restriction enzyme digestion analyses. Results must be combined with clinical information for the most accurate interpretation. Molecular-based testing is highly accurate, but as in any laboratory test, diagnostic errors may occur. False positive or false negative results may occur for reasons that include genetic variants, blood transfusions, bone marrow transplantation, somatic or tissue-specific mosaicism, mislabeled samples, or erroneous representation of family relationships. This test was developed and its performance characteristics determined by [facility]. It has not been cleared or approved by the Food and Drug Administration. References: Kennyth Arnold, [address] Kowdley Clarnce Flock LW, [place]av[place] AS; American Association for the Study of Liver Diseases. Diagnosis and management of hemochromat                          osis: 2011 practice guideline by the American Association for the Study of Liver Diseases. Hepatology. 2011 Jul;54(1):328-43. doi: 10.1002/hep.24330. PMID: 16109604; PMCID: [id number]. [address], Brissot P, Swinkels DW, [employer], Kamarainen O, Patton S, Alonso I, Morris M, Keeney S. EMQN best practice guidelines for the molecular genetic diagnosis of hereditary hemochromatosis [facility]). Eur J Hum Genet. 2016 Apr;24(4):479-95. doi: 10.1038/ejhg.2015.128. Epub 2015 Jul 8. PMID: 78295621; PMCID: HYQ6578469.    Reviewed by: 09/05/2022 Comment   Final   Comment: (NOTE) Technical Component performed at [facility] Professional Component performed by: Lenis Dickinson,  Ph.D., [facility] Director, Molecular Genetics [address] [place] [address] Performed At: [facility] [address] [place], [place] [id number] Maurine Simmering MDPhD UU:[id number]   Clinical Support on 09/03/2022  Component Date Value Ref Range Status   CRP 09/03/2022 1.1 (H)  <1.0 mg/dL Final   Performed at [facility], [address] [place], [place] [address]   Sed Rate 09/03/2022 5  0 - 16 mm/hr Final   Performed at [facility], [address] [place], [place] [address]   Iron 09/03/2022 74  45 - 182 ug/dL Final   TIBC 75/64/3329 318  250 - 450 ug/dL Final   Saturation Ratios 09/03/2022 23  17.9 - 39.5 % Final   UIBC 09/03/2022 244  ug/dL Final   Performed at [facility], [address], [place], [place] [address]  Office Visit on 09/03/2022  Component Date Value Ref Range Status   Ferritin 09/03/2022 342 (H)  24 - 336 ng/mL Final   Performed at [facility], [address] [place], [place] [address]    RADIOGRAPHIC STUDIES: I have personally reviewed the radiological images as listed and agree with the findings in the report  No results found.  ASSESSMENT/PLAN  Elevated Ferritin  August 26, 2022: Routine labs drawn as part of yearly evaluation notable for Ferritin 567   September 17 2022-  Follow up testing demonstrated a normal ferritin.  CRP and ESR were not suggestive of a significant inflammatory disorder.  No need for therapeutic phlebotomy.  Recommended holding supplmental iron.   September 30 2022-  HFE gene testing (H63D/wild type)    Heterozygous (H63D/wild type):  Generally not associated with iron overload.  H63D homozygosity is associated with an  elevated mean ferritin level, but in one study only 6.7% had documented iron overload thus the penetrance of the H63D mutation appeared to be low.  Recommendation September 30 2022- Iron studies and ferritin to be done yearly by primary care. If there is an  upward trend then would recommend blood donation or therapeutic phlebotomy.  Happy to see in follow up as needed.  Would not give iron supplementation unless iron deficiency is demonstrated by lab studies.  Bleeding source should also be identified if that were to occur.  Patient in agreement with the plan    Cancer Staging  No matching staging information was found for the patient.   No problem-specific Assessment & Plan notes found for this encounter.    No orders of the defined types were placed in this encounter.   30  minutes was spent in patient care.  This included time spent preparing to see the patient (e.g., review of tests), obtaining and/or reviewing separately obtained history, counseling and educating the patient/family/caregiver, ordering medications, tests, or procedures; documenting clinical information in the electronic or other health record, independently interpreting results and communicating results to the patient/family/caregiver as well as coordination of care.       All questions were answered. The patient knows to call the clinic with any problems, questions or concerns.  This note was electronically signed.    Loni Muse, MD  09/30/2022 4:20 PM

## 2022-10-02 DIAGNOSIS — M6281 Muscle weakness (generalized): Secondary | ICD-10-CM | POA: Diagnosis not present

## 2022-10-02 DIAGNOSIS — M25561 Pain in right knee: Secondary | ICD-10-CM | POA: Diagnosis not present

## 2022-10-14 DIAGNOSIS — M6281 Muscle weakness (generalized): Secondary | ICD-10-CM | POA: Diagnosis not present

## 2022-10-14 DIAGNOSIS — M25561 Pain in right knee: Secondary | ICD-10-CM | POA: Diagnosis not present

## 2022-10-16 DIAGNOSIS — M6281 Muscle weakness (generalized): Secondary | ICD-10-CM | POA: Diagnosis not present

## 2022-10-16 DIAGNOSIS — M25561 Pain in right knee: Secondary | ICD-10-CM | POA: Diagnosis not present

## 2022-12-17 ENCOUNTER — Ambulatory Visit: Payer: Medicare Other | Admitting: Oncology

## 2022-12-17 ENCOUNTER — Other Ambulatory Visit: Payer: Medicare Other

## 2022-12-29 DIAGNOSIS — M25561 Pain in right knee: Secondary | ICD-10-CM | POA: Diagnosis not present

## 2022-12-29 DIAGNOSIS — G8929 Other chronic pain: Secondary | ICD-10-CM | POA: Diagnosis not present

## 2022-12-29 MED ORDER — METHYLPREDNISOLONE ACETATE 40 MG/ML IJ SUSP
40.0000 mg | Freq: Once | INTRAMUSCULAR | Status: AC
  Administered 2022-12-29: 40 mg via INTRA_ARTICULAR

## 2022-12-29 NOTE — Addendum Note (Signed)
Addended by: Annita Brod on: 12/29/2022 02:28 PM   Modules accepted: Orders

## 2023-02-25 DIAGNOSIS — H40033 Anatomical narrow angle, bilateral: Secondary | ICD-10-CM | POA: Diagnosis not present

## 2023-02-25 DIAGNOSIS — H2513 Age-related nuclear cataract, bilateral: Secondary | ICD-10-CM | POA: Diagnosis not present

## 2023-03-13 ENCOUNTER — Ambulatory Visit (INDEPENDENT_AMBULATORY_CARE_PROVIDER_SITE_OTHER): Payer: Medicare Other | Admitting: Internal Medicine

## 2023-03-13 ENCOUNTER — Encounter: Payer: Self-pay | Admitting: Internal Medicine

## 2023-03-13 VITALS — BP 138/68 | HR 81 | Ht 68.7 in | Wt 194.8 lb

## 2023-03-13 DIAGNOSIS — E039 Hypothyroidism, unspecified: Secondary | ICD-10-CM | POA: Diagnosis not present

## 2023-03-13 DIAGNOSIS — E042 Nontoxic multinodular goiter: Secondary | ICD-10-CM

## 2023-03-13 NOTE — Patient Instructions (Signed)

## 2023-03-13 NOTE — Progress Notes (Signed)
Name: Derek Lowe  MRN/ DOB: [id number], 17-Feb-1951    Age/ Sex: 72 y.o., male     PCP: Ellis Parents, FNP   Reason for Endocrinology Evaluation: Hypothyroidism/MNG     Initial Endocrinology Clinic Visit: 05/06/2017    PATIENT IDENTIFIER: Derek Lowe is a 72 y.o., male with a past medical history of multinodular goiter and hypothyroid. He has followed with [facility] since 05/06/2017 for consultative assistance with management of his MNG.   HISTORICAL SUMMARY: The patient was first diagnosed with hypothyroidism in 2018.    He was diagnosed with multinodular goiter based on thyroid ultrasound from February 2020.   He is s/p FNA of the left thyroid nodule with scant cellularity (Bethesda category I) 08/03/2018  Per previous endocrinologist note patient declined repeat FNA, the pt denied refusing repeat FNA on his visit with me 03/2022  He was seen by Dr. Everardo All between 2018 until October 2022   Sister with thyroid disease  SUBJECTIVE:    Today (03/13/2023):  Derek Lowe is here for for follow-up on hypothyroidism and multinodular goiter.  Weight has been decreasing  Has been doing PT for the right knee injury   Denies local neck swelling  Denies constipation  Denies palpitations  Has minimal  tremors     Levothyroxine 25 mcg daily       HISTORY:  Past Medical History:  Past Medical History:  Diagnosis Date   Arthritis    Hypothyroidism    Past Surgical History:  Past Surgical History:  Procedure Laterality Date   COLONOSCOPY  2011   Social History:  reports that he has never smoked. He has quit using smokeless tobacco.  His smokeless tobacco use included chew. He reports current alcohol use. He reports that he does not use drugs. Family History:  Family History  Problem Relation Age of Onset   Stomach cancer Father    Thyroid disease Sister    Colon cancer Neg Hx    Colon polyps Neg Hx    Esophageal cancer Neg Hx    Rectal  cancer Neg Hx      HOME MEDICATIONS: Allergies as of 03/13/2023   No Known Allergies      Medication List        Accurate as of March 13, 2023  2:12 PM. If you have any questions, ask your nurse or doctor.          levothyroxine 25 MCG tablet Commonly known as: SYNTHROID Take 1 tablet (25 mcg total) by mouth daily.   loratadine 10 MG tablet Commonly known as: CLARITIN Take 10 mg by mouth daily.          OBJECTIVE:   PHYSICAL EXAM: VS: BP 138/68 (BP Location: Left Arm, Patient Position: Sitting, Cuff Size: Small)   Pulse 81   Ht 5' 8.7" (1.745 m)   Wt 194 lb 12.8 oz (88.4 kg)   SpO2 97%   BMI 29.02 kg/m   Filed Weights   03/13/23 1407  Weight: 194 lb 12.8 oz (88.4 kg)    EXAM: General: Pt appears well and is in NAD  Neck: General: Supple without adenopathy. Thyroid: Thyroid size normal.  No goiter or nodules appreciated.   Lungs: Clear with good BS bilat   Heart: Auscultation: RRR.  Extremities:  BL LE: No pretibial edema   Mental Status: Judgment, insight: Intact Orientation: Oriented to time, place, and person Mood and affect: No depression, anxiety, or agitation     DATA REVIEWED:  Latest Reference Range & Units 03/13/23 14:31  TSH 0.40 - 4.50 mIU/L 3.08    Thyroid ultrasound 03/17/2022 Estimated total number of nodules >/= 1 cm: 4   Number of spongiform nodules >/=  2 cm not described below (TR1): 0   Number of mixed cystic and solid nodules >/= 1.5 cm not described below (TR2): 0   _________________________________________________________   Nodule 1: 1.3 x 1.1 x 0.9 cm isoechoic region in the superior right thyroid lobe favored to be pseudo nodule given lack of defined margins.   _________________________________________________________   Nodule # 2:   Prior biopsy: No   Location: Right; Inferior   Maximum size: 1.4 cm; Other 2 dimensions: 1.4 x 0.8 cm, previously, 1.2 x 1.2 x 1.1 cm   Composition: solid/almost  completely solid (2)   Echogenicity: isoechoic (1)   Shape: not taller-than-wide (0)   Margins: ill-defined (0)   Echogenic foci: none (0)   ACR TI-RADS total points: 3.   ACR TI-RADS risk category:  TR3 (3 points).   Significant change in size (>/= 20% in two dimensions and minimal increase of 2 mm): No   Change in features: No   Change in ACR TI-RADS risk category: No   ACR TI-RADS recommendations:   Given size (<1.4 cm) and appearance, this nodule does NOT meet TI-RADS criteria for biopsy or dedicated follow-up.   _________________________________________________________   Nodule # 3:   Prior biopsy: No   Location: Left; superior   Maximum size: 1.6 cm; Other 2 dimensions: 1.2 x 1.2 cm, previously, 1.4 x 1.3 x 1.2 cm on 07/27/2018   Composition: solid/almost completely solid (2)   Echogenicity: isoechoic (1)   Shape: not taller-than-wide (0)   Margins: smooth (0)   Echogenic foci: none (0)   ACR TI-RADS total points: 3.   ACR TI-RADS risk category:  TR3 (3 points).   Significant change in size (>/= 20% in two dimensions and minimal increase of 2 mm): No   Change in features: No   Change in ACR TI-RADS risk category: No   ACR TI-RADS recommendations:   *Given size (>/= 1.5 - 2.4 cm) and appearance, a follow-up ultrasound in 1 year should be considered based on TI-RADS criteria.   _________________________________________________________   Nodule 4: 2.4 x 1.7 x 1.1 cm left inferior thyroid nodule is not significantly changed in size since 07/27/2018 where measured 2.5 x 1.1 x 1.8 cm. Please correlate with prior FNA results from 08/03/2018.   IMPRESSION: 1. Nodule 4 (TI-RADS 3), measuring 2.4 x 1.7 x 1.1 cm, is not significantly changed in size since 07/27/2018. Please correlate with prior FNA results from 08/03/2018. 2. Nodule 3 (TI-RADS 3), measuring 1.6 cm, located in the superior left thyroid lobe is not significantly changed in size  since 07/27/2018 where it measured 1.4 x 1.3 x 1.2 cm, but now meets criteria for imaging follow-up due to slight increase in maximum dimension. Annual ultrasound surveillance is recommended until 5 years of stability is documented.    ASSESSMENT / PLAN / RECOMMENDATIONS:   Hypothyroidism  - Pt is clinically and biochemically euthyroid  - We discussed switching levothyroxine 50 mcg , half a tablet to 25 mcg daily to make it easy on his wife who has been cutting them  - He denies any intolerance issues with levothyroxine 25 mcg    Medications    Levothyroxine 25 mcg daily     2. MNG:  - No local neck symptoms  - He is s/p  FNA of the left thyroid nodule with scant cellularity (Bethesda category I) 08/03/2018 -Repeat thyroid ultrasound in 2023 shows stability -Will proceed with repeat ultrasound this year  F/U in 1 yr   Signed electronically by: Lyndle Herrlich, MD  [facility] Endocr[facility]ology  [facility] [address] [place] [place], [place] [address] Phone: [phone] FAX: [phone]      CC: Ellis Parents, FNP [address] [place] [address] Phone: [phone]  Fax: [phone]   Return to Endocrinology clinic as below: No future appointments.

## 2023-03-14 LAB — TSH: TSH: 3.08 m[IU]/L (ref 0.40–4.50)

## 2023-03-16 ENCOUNTER — Encounter: Payer: Self-pay | Admitting: Internal Medicine

## 2023-03-16 MED ORDER — LEVOTHYROXINE SODIUM 25 MCG PO TABS: 25.0000 ug | ORAL_TABLET | Freq: Every day | ORAL | 3 refills | Status: DC

## 2023-03-27 ENCOUNTER — Other Ambulatory Visit: Payer: Medicare Other

## 2023-03-30 ENCOUNTER — Other Ambulatory Visit: Payer: Medicare Other

## 2023-04-06 ENCOUNTER — Ambulatory Visit
Admission: RE | Admit: 2023-04-06 | Discharge: 2023-04-06 | Disposition: A | Discharge status: Home / Self Care | Payer: Medicare Other | Source: Ambulatory Visit | Attending: Internal Medicine | Admitting: Internal Medicine

## 2023-04-06 DIAGNOSIS — E041 Nontoxic single thyroid nodule: Secondary | ICD-10-CM | POA: Diagnosis not present

## 2023-04-06 DIAGNOSIS — E042 Nontoxic multinodular goiter: Secondary | ICD-10-CM

## 2023-04-20 ENCOUNTER — Encounter: Payer: Self-pay | Admitting: Internal Medicine

## 2023-08-07 DIAGNOSIS — Z9181 History of falling: Secondary | ICD-10-CM | POA: Diagnosis not present

## 2023-08-07 DIAGNOSIS — R21 Rash and other nonspecific skin eruption: Secondary | ICD-10-CM | POA: Diagnosis not present

## 2023-08-07 DIAGNOSIS — Z83438 Family history of other disorder of lipoprotein metabolism and other lipidemia: Secondary | ICD-10-CM | POA: Diagnosis not present

## 2023-08-07 DIAGNOSIS — Z125 Encounter for screening for malignant neoplasm of prostate: Secondary | ICD-10-CM | POA: Diagnosis not present

## 2023-08-07 DIAGNOSIS — N529 Male erectile dysfunction, unspecified: Secondary | ICD-10-CM | POA: Diagnosis not present

## 2023-08-07 DIAGNOSIS — Z139 Encounter for screening, unspecified: Secondary | ICD-10-CM | POA: Diagnosis not present

## 2023-08-07 DIAGNOSIS — E079 Disorder of thyroid, unspecified: Secondary | ICD-10-CM | POA: Diagnosis not present

## 2023-08-07 DIAGNOSIS — D649 Anemia, unspecified: Secondary | ICD-10-CM | POA: Diagnosis not present

## 2024-02-10 DIAGNOSIS — R7989 Other specified abnormal findings of blood chemistry: Secondary | ICD-10-CM | POA: Diagnosis not present

## 2024-02-10 DIAGNOSIS — Z1331 Encounter for screening for depression: Secondary | ICD-10-CM | POA: Diagnosis not present

## 2024-02-10 DIAGNOSIS — E559 Vitamin D deficiency, unspecified: Secondary | ICD-10-CM | POA: Diagnosis not present

## 2024-02-10 DIAGNOSIS — E079 Disorder of thyroid, unspecified: Secondary | ICD-10-CM | POA: Diagnosis not present

## 2024-02-10 DIAGNOSIS — Z9181 History of falling: Secondary | ICD-10-CM | POA: Diagnosis not present

## 2024-02-10 DIAGNOSIS — L309 Dermatitis, unspecified: Secondary | ICD-10-CM | POA: Diagnosis not present

## 2024-03-14 ENCOUNTER — Ambulatory Visit: Payer: Medicare Other | Admitting: Internal Medicine

## 2024-03-15 ENCOUNTER — Encounter: Payer: Self-pay | Admitting: Internal Medicine

## 2024-03-15 ENCOUNTER — Other Ambulatory Visit

## 2024-03-15 ENCOUNTER — Ambulatory Visit (INDEPENDENT_AMBULATORY_CARE_PROVIDER_SITE_OTHER): Admitting: Internal Medicine

## 2024-03-15 VITALS — BP 128/74 | HR 82 | Ht 68.7 in | Wt 201.2 lb

## 2024-03-15 DIAGNOSIS — E039 Hypothyroidism, unspecified: Secondary | ICD-10-CM

## 2024-03-15 DIAGNOSIS — E042 Nontoxic multinodular goiter: Secondary | ICD-10-CM | POA: Diagnosis not present

## 2024-03-15 NOTE — Progress Notes (Unsigned)
 Name: Derek Lowe  MRN/ DOB: [id number], [birth year]    Age/ Sex: 73 y.o., male     PCP: Derek Dwayne NOVAK, FNP   Reason for Endocrinology Evaluation: Hypothyroidism/MNG     Initial Endocrinology Clinic Visit: 05/06/2017    PATIENT IDENTIFIER: Mr. Derek Lowe is a 72 y.o., male with a past medical history of multinodular goiter and hypothyroid. He has followed with [facility] since 05/06/2017 for consultative assistance with management of his MNG.   HISTORICAL SUMMARY: The patient was first diagnosed with hypothyroidism in 2018.    He was diagnosed with multinodular goiter based on thyroid  ultrasound from February 2020.   He is s/p FNA of the left thyroid  nodule with scant cellularity (Bethesda category I) 08/03/2018  Per previous endocrinologist note patient declined repeat FNA, the pt denied refusing repeat FNA on his visit with me 03/2022  He was seen by Dr. Kassie between 2018 until October 2022   Sister with thyroid  disease  SUBJECTIVE:    Today (03/15/2024):  Derek Lowe is here for for follow-up on hypothyroidism and multinodular goiter.   Weight continues to fluctuate No local neck swelling  He has noted some dysphagia , has occasional morning dry mouth  No palpitations  Continues with fine tremors  No constipation or diarrhea     Levothyroxine  25 mcg daily       HISTORY:  Past Medical History:  Past Medical History:  Diagnosis Date   Arthritis    Hypothyroidism    Past Surgical History:  Past Surgical History:  Procedure Laterality Date   COLONOSCOPY  2011   Social History:  reports that he has never smoked. He has quit using smokeless tobacco.  His smokeless tobacco use included chew. He reports current alcohol use. He reports that he does not use drugs. Family History:  Family History  Problem Relation Age of Onset   Stomach cancer Father    Thyroid  disease Sister    Colon cancer Neg Hx    Colon polyps Neg Hx     Esophageal cancer Neg Hx    Rectal cancer Neg Hx      HOME MEDICATIONS: Allergies as of 03/15/2024   No Known Allergies      Medication List        Accurate as of March 15, 2024  2:17 PM. If you have any questions, ask your nurse or doctor.          levothyroxine  25 MCG tablet Commonly known as: SYNTHROID  Take 1 tablet (25 mcg total) by mouth daily.   loratadine 10 MG tablet Commonly known as: CLARITIN Take 10 mg by mouth daily.          OBJECTIVE:   PHYSICAL EXAM: VS: BP 128/74 (BP Location: Left Arm, Patient Position: Sitting, Cuff Size: Normal)   Pulse 82   Ht 5' 8.7 (1.745 m)   Wt 201 lb 3.2 oz (91.3 kg)   SpO2 98%   BMI 29.97 kg/m   Filed Weights   03/15/24 1415  Weight: 201 lb 3.2 oz (91.3 kg)     EXAM: General: Pt appears well and is in NAD  Neck: General: Supple without adenopathy. Thyroid : Thyroid  size normal.  No goiter or nodules appreciated.   Lungs: Clear with good BS bilat   Heart: Auscultation: RRR.  Extremities:  BL LE: No pretibial edema   Mental Status: Judgment, insight: Intact Orientation: Oriented to time, place, and person Mood and affect: No depression, anxiety, or agitation  DATA REVIEWED:  Latest Reference Range & Units 03/15/24 14:36  TSH 0.40 - 4.50 mIU/L 3.38  T4,Free(Direct) 0.8 - 1.8 ng/dL 1.1    Thyroid  ultrasound 04/06/2023   FINDINGS: Parenchymal Echotexture: Moderately heterogenous   Isthmus: 0.5 cm   Right lobe: 5.2 x 2.4 x 2.0 cm   Left lobe: 4.4 x 2.2 x 2.0 cm   _________________________________________________________   Estimated total number of nodules >/= 1 cm: 5   Number of spongiform nodules >/=  2 cm not described below (TR1): 0   Number of mixed cystic and solid nodules >/= 1.5 cm not described below (TR2): 0   _________________________________________________________   Nodule # 1: Mixed cystic and solid nodule at the right upper gland is consistent with TI-RADS category 2.  This nodule does NOT meet TI-RADS criteria for biopsy or dedicated follow-up.   Nodule # 3: Isoechoic solid nodule in the right lower gland measures no more than 1.1 cm. TI-RADS category 3. Given size (<1.4 cm) and appearance, this nodule does NOT meet TI-RADS criteria for biopsy or dedicated follow-up.   Nodule # 4: No significant interval change in the size or appearance of the isoechoic solid nodule at the left upper gland measuring 1.5 x 1.1 x 0.8 cm. Nodule previously measured 1.6 x 1.2 x 1.2 cm in February of 2020. This exam essentially confirms 5 years of stability. No further follow-up evaluation recommended.   Nodule # 5: Previously biopsied nodule in the left lower gland remains unchanged at 2.4 x 1.6 x 1.1 cm.   IMPRESSION: 1. No significant interval change in the size or appearance of the previously biopsied nodule in the left inferior gland. Assuming a previously benign pathologic result, no further follow-up imaging is recommended. 2. Confirmed 5 year stability of nodule in the superior aspect of the left gland consistent with a benign lesion. No further imaging follow-up recommended. 3. Similar appearance of small low risk/benign nodules in the right gland.   The above is in keeping with the ACR TI-RADS recommendations - J Am Coll Radiol 2017;14:587-595.    ASSESSMENT / PLAN / RECOMMENDATIONS:   Hypothyroidism  - Pt is clinically and biochemically euthyroid  - TFTs are within normal range, no change  Medications   Continue levothyroxine  25 mcg daily     2. MNG:  - No local neck symptoms  - He is s/p FNA of the left thyroid  nodule with scant cellularity (Bethesda category I) 08/03/2018 -Repeat thyroid  ultrasound in 2023 shows stability -Repeat thyroid  ultrasound in 2024 confirmed stability - I have suggested proceeding with the last ultrasound for this year to complete a 5-year follow-up    F/U in 1 yr   Signed electronically by: Derek Redgie Butts, MD  [facility]  [facility] [address] [place] [place], [place] [address] Phone: [phone] FAX: [phone]      CC: Derek Dwayne NOVAK, FNP [address] [place] [address] Phone: [phone]  Fax: [phone]   Return to Endocrinology clinic as below: Future Appointments  Date Time Provider Department Center  03/15/2024  2:20 PM Brynnleigh Mcelwee, Donell Redgie, MD [id number] None

## 2024-03-15 NOTE — Patient Instructions (Signed)

## 2024-03-16 ENCOUNTER — Ambulatory Visit: Payer: Self-pay | Admitting: Internal Medicine

## 2024-03-16 LAB — TSH: TSH: 3.38 m[IU]/L (ref 0.40–4.50)

## 2024-03-16 LAB — T4, FREE: Free T4: 1.1 ng/dL (ref 0.8–1.8)

## 2024-03-16 MED ORDER — LEVOTHYROXINE SODIUM 25 MCG PO TABS: 25.0000 ug | ORAL_TABLET | Freq: Every day | ORAL | 3 refills | Status: AC

## 2024-03-20 DIAGNOSIS — R21 Rash and other nonspecific skin eruption: Secondary | ICD-10-CM | POA: Diagnosis not present

## 2024-03-20 DIAGNOSIS — B029 Zoster without complications: Secondary | ICD-10-CM | POA: Diagnosis not present

## 2024-03-23 ENCOUNTER — Ambulatory Visit
Admission: RE | Admit: 2024-03-23 | Discharge: 2024-03-23 | Disposition: A | Source: Ambulatory Visit | Attending: Internal Medicine | Admitting: Internal Medicine

## 2024-03-23 DIAGNOSIS — E042 Nontoxic multinodular goiter: Secondary | ICD-10-CM

## 2024-03-29 DIAGNOSIS — B0223 Postherpetic polyneuropathy: Secondary | ICD-10-CM | POA: Diagnosis not present

## 2024-03-29 DIAGNOSIS — R21 Rash and other nonspecific skin eruption: Secondary | ICD-10-CM | POA: Diagnosis not present

## 2024-03-29 DIAGNOSIS — B029 Zoster without complications: Secondary | ICD-10-CM | POA: Diagnosis not present

## 2024-04-13 DIAGNOSIS — B029 Zoster without complications: Secondary | ICD-10-CM | POA: Diagnosis not present

## 2024-04-13 DIAGNOSIS — B0223 Postherpetic polyneuropathy: Secondary | ICD-10-CM | POA: Diagnosis not present

## 2024-04-18 DIAGNOSIS — H40033 Anatomical narrow angle, bilateral: Secondary | ICD-10-CM | POA: Diagnosis not present

## 2024-04-18 DIAGNOSIS — H2513 Age-related nuclear cataract, bilateral: Secondary | ICD-10-CM | POA: Diagnosis not present

## 2024-04-25 DIAGNOSIS — B0229 Other postherpetic nervous system involvement: Secondary | ICD-10-CM | POA: Diagnosis not present

## 2025-03-15 ENCOUNTER — Ambulatory Visit: Admitting: Internal Medicine
